# Patient Record
Sex: Male | Born: 1980 | Race: White | Hispanic: No | Marital: Married | Smoking: Never smoker
Health system: Southern US, Community
[De-identification: ages and names within clinical notes are randomized; demographics above are authoritative.]

## PROBLEM LIST (undated history)

## (undated) DIAGNOSIS — R011 Cardiac murmur, unspecified: Secondary | ICD-10-CM

## (undated) DIAGNOSIS — E039 Hypothyroidism, unspecified: Secondary | ICD-10-CM

## (undated) DIAGNOSIS — E119 Type 2 diabetes mellitus without complications: Secondary | ICD-10-CM

## (undated) DIAGNOSIS — I10 Essential (primary) hypertension: Secondary | ICD-10-CM

## (undated) HISTORY — PX: OTHER SURGICAL HISTORY: SHX169

---

## 2015-03-11 ENCOUNTER — Other Ambulatory Visit: Payer: Self-pay | Admitting: Family Medicine

## 2015-03-11 ENCOUNTER — Ambulatory Visit
Admission: RE | Admit: 2015-03-11 | Discharge: 2015-03-11 | Disposition: A | Discharge status: Home / Self Care | Payer: BC Managed Care – PPO | Source: Ambulatory Visit | Attending: Family Medicine | Admitting: Family Medicine

## 2015-03-11 ENCOUNTER — Encounter (INDEPENDENT_AMBULATORY_CARE_PROVIDER_SITE_OTHER): Payer: Self-pay

## 2015-03-11 DIAGNOSIS — R509 Fever, unspecified: Secondary | ICD-10-CM

## 2016-04-12 IMAGING — CR DG CHEST 2V
2 series · 2 of 2 positions shown · non-contrast
Comparison: None.

CLINICAL DATA: Fever and cough for 2 weeks

EXAM:
CHEST  2 VIEW

[w chest pa]
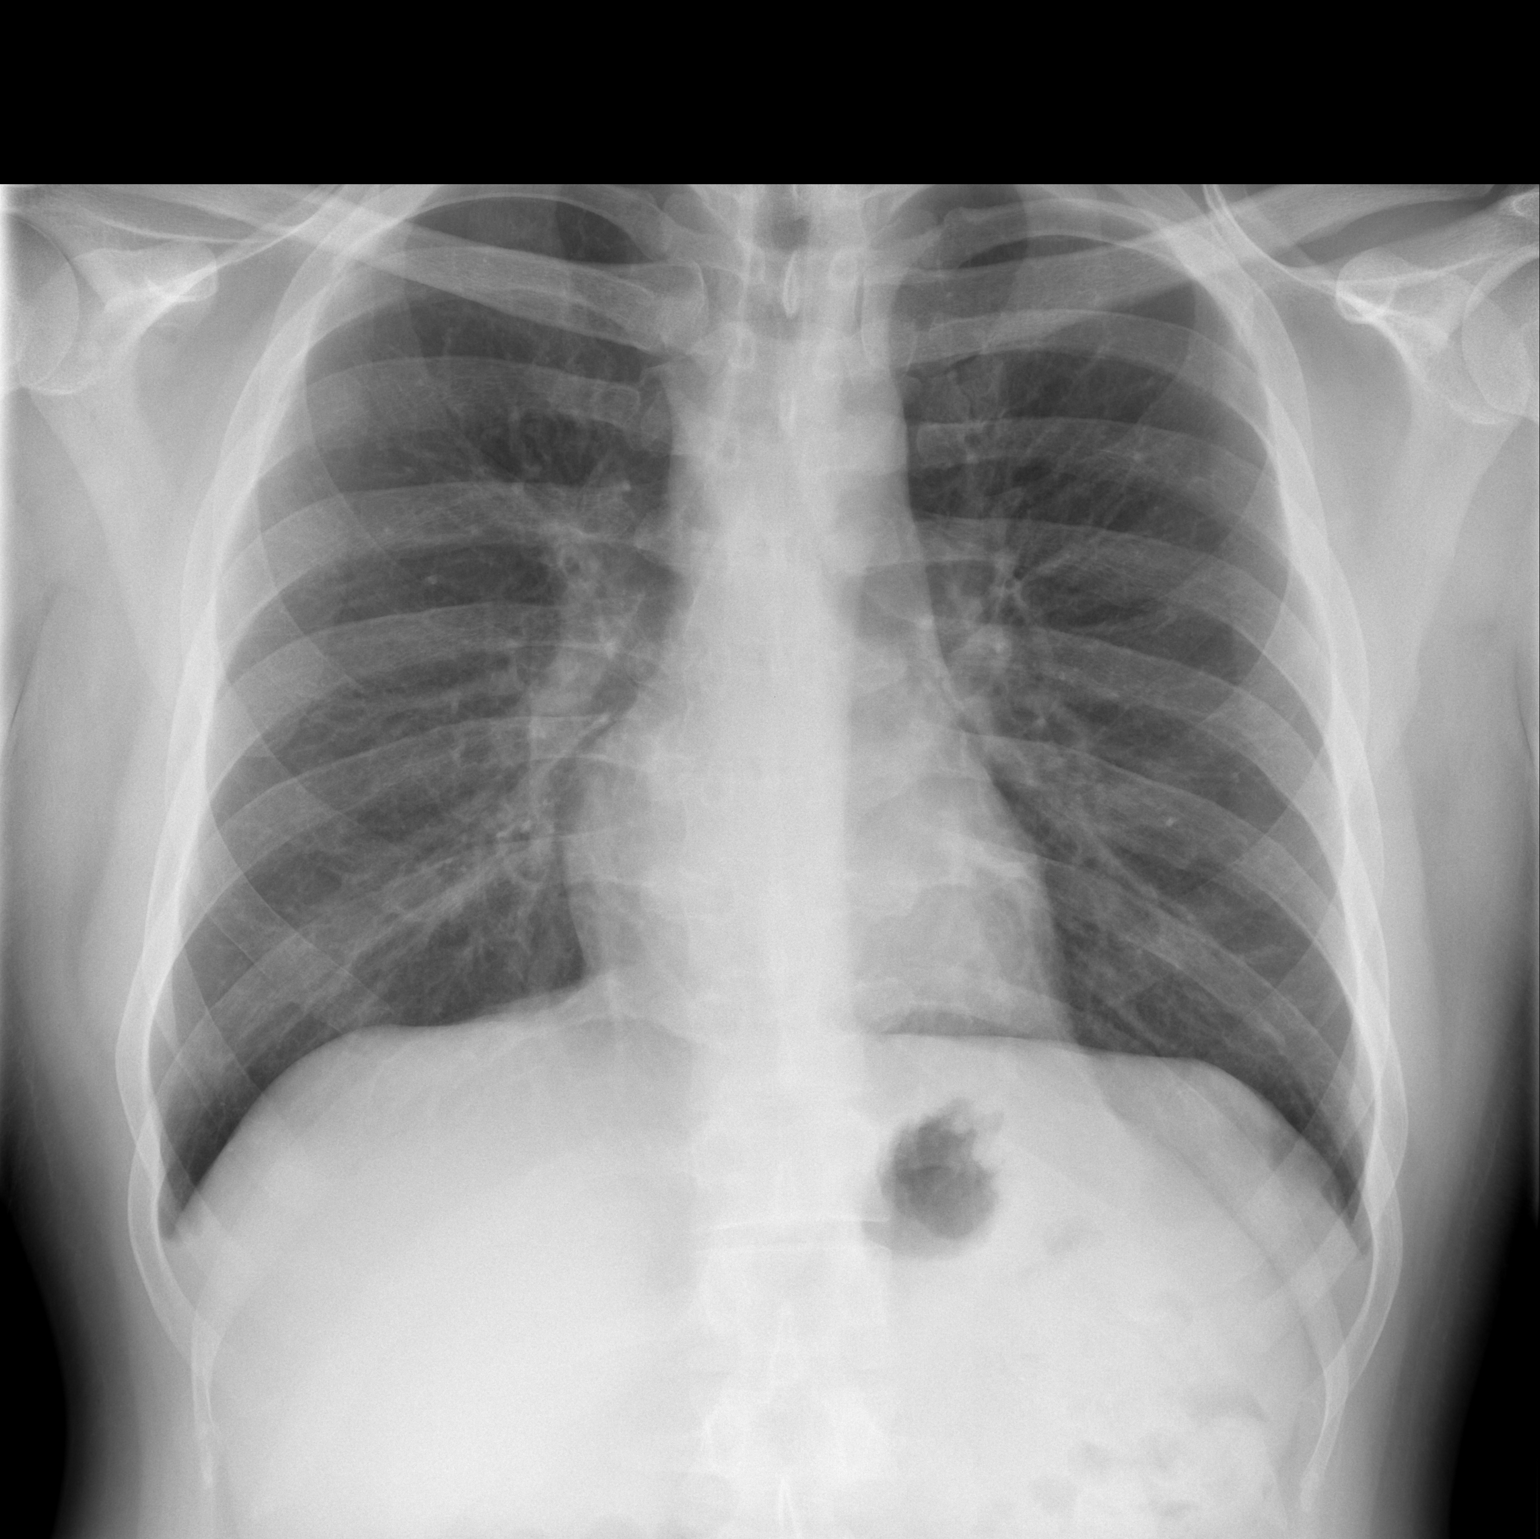

[w chest lat]
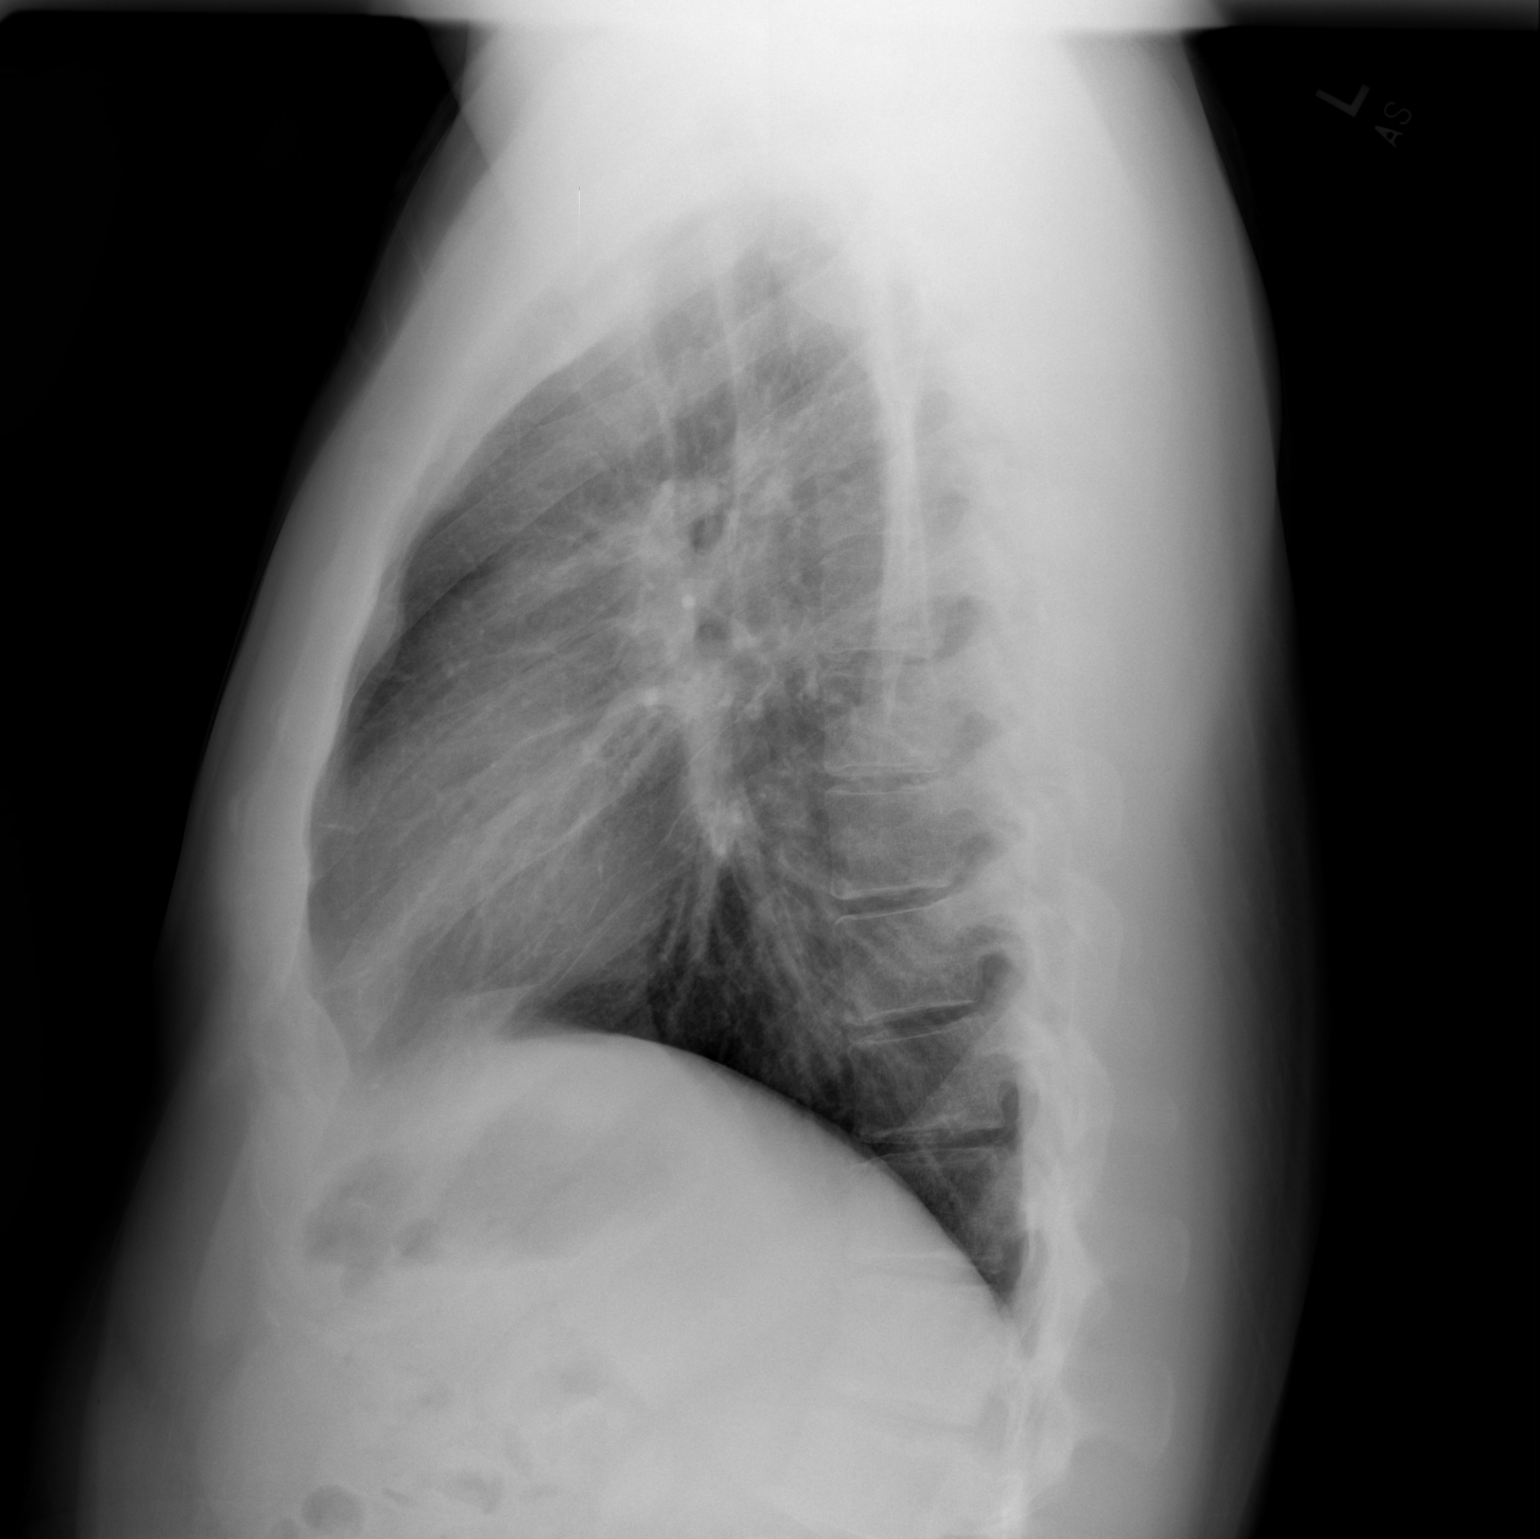

[2 of 2 positions shown; findings below may reference images not displayed]

FINDINGS: Cardiomediastinal silhouette is unremarkable. No acute infiltrate or
pleural effusion. No pulmonary edema. Bony thorax is unremarkable
IMPRESSION: No active cardiopulmonary disease.

## 2020-05-20 ENCOUNTER — Other Ambulatory Visit: Payer: Self-pay | Admitting: Family Medicine

## 2020-05-20 DIAGNOSIS — R7989 Other specified abnormal findings of blood chemistry: Secondary | ICD-10-CM

## 2020-06-03 ENCOUNTER — Ambulatory Visit
Admission: RE | Admit: 2020-06-03 | Discharge: 2020-06-03 | Disposition: A | Discharge status: Home / Self Care | Payer: BC Managed Care – PPO | Source: Ambulatory Visit | Attending: Family Medicine | Admitting: Family Medicine

## 2020-06-03 DIAGNOSIS — R7989 Other specified abnormal findings of blood chemistry: Secondary | ICD-10-CM

## 2020-07-18 ENCOUNTER — Other Ambulatory Visit (HOSPITAL_COMMUNITY)
Admission: RE | Admit: 2020-07-18 | Discharge: 2020-07-18 | Disposition: A | Discharge status: Home / Self Care | Payer: BC Managed Care – PPO | Source: Ambulatory Visit | Attending: Surgery | Admitting: Surgery

## 2020-07-18 ENCOUNTER — Ambulatory Visit: Payer: Self-pay | Admitting: Surgery

## 2020-07-18 DIAGNOSIS — K8010 Calculus of gallbladder with chronic cholecystitis without obstruction: Secondary | ICD-10-CM

## 2020-07-18 DIAGNOSIS — Z01812 Encounter for preprocedural laboratory examination: Secondary | ICD-10-CM | POA: Insufficient documentation

## 2020-07-18 DIAGNOSIS — Z20822 Contact with and (suspected) exposure to covid-19: Secondary | ICD-10-CM | POA: Diagnosis not present

## 2020-07-18 DIAGNOSIS — E669 Obesity, unspecified: Secondary | ICD-10-CM

## 2020-07-18 DIAGNOSIS — E119 Type 2 diabetes mellitus without complications: Secondary | ICD-10-CM | POA: Insufficient documentation

## 2020-07-18 LAB — SARS CORONAVIRUS 2 (TAT 6-24 HRS): SARS Coronavirus 2: NEGATIVE

## 2020-07-18 NOTE — H&P (View-Only) (Signed)
Jordan Harper Appointment: 07/18/2020 11:00 AM Location: [facility] Patient #: [id number] DOB: 09/24/1981 Married / Language: Jordan Harper / Race: White Male  History of Present Illness Jordan Sportsman MD; 07/18/2020 12:19 PM) The patient is a 39 year old male who presents with abdominal pain. Note for "Abdominal pain": ` ` ` Patient sent for surgical consultation at the request of [facility]  Chief Complaint: Abdominal pain. Possible gallbladder etiology. 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 Diabetic on oral hypoglycemic. Goes on regular hikes with his family. Did have some mildly elevated bilirubin and ALT on the liver function tests. Subtle. Did have an ultrasound which revealed gallstones as well as some fatty steatosis. This was discussed with the patient. Patient's aunt had to have emergency gallbladder surgery. He is noted some and interdependent abdominal complaints. Surgery consultation offered. Patient denies any history of prior abdominal surgery. Usually moves his bowels every day or so. That he may have a few drinks of alcohol a month most. Certainly no binge drinking. He notes he will get intermittent right upper quadrant pain with fried food especially. Some mild nausea and bloating but no emesis. Denies heartburn or reflux. Just hiked up with one of the mountains last weekend without any difficulty. No symptoms or pain then.  No personal nor family history of GI/colon cancer, inflammatory bowel disease, irritable bowel syndrome, allergy such as Celiac Sprue, dietary/dairy problems, colitis, ulcers nor gastritis. No recent sick contacts/gastroenteritis. No travel outside the country. No changes in diet. No dysphagia to solids or liquids. No significant heartburn or reflux. No melena, hematemesis, coffee ground emesis. No evidence of prior gastric/peptic ulceration.   (Review of systems as stated in  this history (HPI) or in the review of systems. Otherwise all other 12 point ROS are negative) ` ` `  This patient encounter took 30 minutes today to perform the following: obtain history, perform exam, review outside records, interpret tests & imaging, counsel the patient on their diagnosis; and, document this encounter, including findings & plan in the electronic health record (EHR).   Past Surgical History Jordan Harper, CMA; 07/18/2020 11:07 AM) No pertinent past surgical history  Diagnostic Studies History Jordan Harper, CMA; 07/18/2020 11:07 AM) Colonoscopy never  Allergies (Jordan Harper, CMA; 07/18/2020 11:09 AM) Bactrim *ANTI-INFECTIVE AGENTS - MISC.* Hives, Rash.  Medication History (Jordan Harper, CMA; 07/18/2020 11:10 AM) Valium (2MG  Tablet, Oral) Active. Losartan Potassium-HCTZ (100-12.5MG  Tablet, Oral) Active. Levothyroxine Sodium ( Tablet, Oral) Active. metFORMIN HCl ER (500MG  Tablet ER 24HR, Oral) Active. Bisoprolol-hydroCHLOROthiazide (5-6.25MG  Tablet, Oral) Active. Atorvastatin Calcium (40MG  Tablet, Oral) Active. ZyrTEC Allergy (10MG  Tablet, Oral) Active. Medications Reconciled  Social History , CMA; 07/18/2020 11:07 AM) Alcohol use Occasional alcohol use. Caffeine use Carbonated beverages, Coffee, Tea. No drug use Tobacco use Never smoker.  Family History , CMA; 07/18/2020 11:07 AM) Alcohol Abuse Family Members In General. Diabetes Mellitus Brother, Family Members In General, Father. Heart Disease Family Members In General, Father. Heart disease in male family member before age 71 Hypertension Brother, Mother. Thyroid problems Family Members In General, Mother.  Other Problems (Jordan Harper, CMA; 07/18/2020 11:07 AM) Cholelithiasis Diabetes Mellitus Heart murmur High blood pressure Thyroid Disease     Review of Systems (Jordan Harper CMA; 07/18/2020 11:07 AM) General Not Present-  Appetite Loss, Chills, Fatigue, Fever, Night Sweats, Weight Gain and Weight Loss. Skin Not Present- Change in Wart/Mole, Dryness, Hives, Jaundice, New Lesions, Non-Healing Wounds, Rash and Ulcer. HEENT  Present- Seasonal Allergies and Wears glasses/contact lenses. Not Present- Earache, Hearing Loss, Hoarseness, Nose Bleed, Oral Ulcers, Ringing in the Ears, Sinus Pain, Sore Throat, Visual Disturbances and Yellow Eyes. Respiratory Present- Snoring. Not Present- Bloody sputum, Chronic Cough, Difficulty Breathing and Wheezing. Breast Not Present- Breast Mass, Breast Pain, Nipple Discharge and Skin Changes. Cardiovascular Not Present- Chest Pain, Difficulty Breathing Lying Down, Leg Cramps, Palpitations, Rapid Heart Rate, Shortness of Breath and Swelling of Extremities. Gastrointestinal Present- Abdominal Pain. Not Present- Bloating, Bloody Stool, Change in Bowel Habits, Chronic diarrhea, Constipation, Difficulty Swallowing, Excessive gas, Gets full quickly at meals, Hemorrhoids, Indigestion, Nausea, Rectal Pain and Vomiting. Male Genitourinary Not Present- Blood in Urine, Change in Urinary Stream, Frequency, Impotence, Nocturia, Painful Urination, Urgency and Urine Leakage. Musculoskeletal Present- Back Pain. Not Present- Joint Pain, Joint Stiffness, Muscle Pain, Muscle Weakness and Swelling of Extremities. Neurological Not Present- Decreased Memory, Fainting, Headaches, Numbness, Seizures, Tingling, Tremor, Trouble walking and Weakness. Psychiatric Not Present- Anxiety, Bipolar, Change in Sleep Pattern, Depression, Fearful and Frequent crying. Endocrine Present- New Diabetes. Not Present- Cold Intolerance, Excessive Hunger, Hair Changes, Heat Intolerance and Hot flashes. Hematology Not Present- Blood Thinners, Easy Bruising, Excessive bleeding, Gland problems, HIV and Persistent Infections.  Vitals (Jordan Harper CMA; 07/18/2020 11:08 AM) 07/18/2020 11:08 AM Weight: 224 lb Height: 72in Body Surface  Area: 2.24 m Body Mass Index: 30.38 kg/m  Temp.: 98.44F  Pulse: 75 (Regular)  P.OX: 98% (Room air) BP: 132/82(Sitting, Left Arm, Standard)        Physical Exam Jordan Sportsman MD; 07/18/2020 12:19 PM)  General Mental Status-Alert. General Appearance-Not in acute distress, Not Sickly. Orientation-Oriented X3. Hydration-Well hydrated. Voice-Normal.  Integumentary Global Assessment Upon inspection and palpation of skin surfaces of the - Axillae: non-tender, no inflammation or ulceration, no drainage. and Distribution of scalp and body hair is normal. General Characteristics Temperature - normal warmth is noted.  Head and Neck Head-normocephalic, atraumatic with no lesions or palpable masses. Face Global Assessment - atraumatic, no absence of expression. Neck Global Assessment - no abnormal movements, no bruit auscultated on the right, no bruit auscultated on the left, no decreased range of motion, non-tender. Trachea-midline. Thyroid Gland Characteristics - non-tender.  Eye Eyeball - Left-Extraocular movements intact, No Nystagmus - Left. Eyeball - Right-Extraocular movements intact, No Nystagmus - Right. Cornea - Left-No Hazy - Left. Cornea - Right-No Hazy - Right. Sclera/Conjunctiva - Left-No scleral icterus, No Discharge - Left. Sclera/Conjunctiva - Right-No scleral icterus, No Discharge - Right. Pupil - Left-Direct reaction to light normal. Pupil - Right-Direct reaction to light normal.  ENMT Ears Pinna - Left - no drainage observed, no generalized tenderness observed. Pinna - Right - no drainage observed, no generalized tenderness observed. Nose and Sinuses External Inspection of the Nose - no destructive lesion observed. Inspection of the nares - Left - quiet respiration. Inspection of the nares - Right - quiet respiration. Mouth and Throat Lips - Upper Lip - no fissures observed, no pallor noted. Lower Lip - no fissures  observed, no pallor noted. Nasopharynx - no discharge present. Oral Cavity/Oropharynx - Tongue - no dryness observed. Oral Mucosa - no cyanosis observed. Hypopharynx - no evidence of airway distress observed.  Chest and Lung Exam Inspection Movements - Normal and Symmetrical. Accessory muscles - No use of accessory muscles in breathing. Palpation Palpation of the chest reveals - Non-tender. Auscultation Breath sounds - Normal and Clear.  Cardiovascular Auscultation Rhythm - Regular. Murmurs & Other Heart Sounds - Auscultation of the heart reveals -  No Murmurs and No Systolic Clicks.  Abdomen Inspection Inspection of the abdomen reveals - No Visible peristalsis and No Abnormal pulsations. Umbilicus - No Bleeding, No Urine drainage. Palpation/Percussion Palpation and Percussion of the abdomen reveal - Soft, Non Tender, No Rebound tenderness, No Rigidity (guarding) and No Cutaneous hyperesthesia. Note: Abdomen soft. Mild discomfort along right subcostal ridge. No true Murphy sign. The rest of the abdomen is soft and nontender. Not distended. No umbilical or incisional hernias. No guarding.  Male Genitourinary Sexual Maturity Tanner 5 - Adult hair pattern and Adult penile size and shape.  Peripheral Vascular Upper Extremity Inspection - Left - No Cyanotic nailbeds - Left, Not Ischemic. Inspection - Right - No Cyanotic nailbeds - Right, Not Ischemic.  Neurologic Neurologic evaluation reveals -normal attention span and ability to concentrate, able to name objects and repeat phrases. Appropriate fund of knowledge , normal sensation and normal coordination. Mental Status Affect - not angry, not paranoid. Cranial Nerves-Normal Bilaterally. Gait-Normal.  Neuropsychiatric Mental status exam performed with findings of-able to articulate well with normal speech/language, rate, volume and coherence, thought content normal with ability to perform basic computations and apply  abstract reasoning and no evidence of hallucinations, delusions, obsessions or homicidal/suicidal ideation.  Musculoskeletal Global Assessment Spine, Ribs and Pelvis - no instability, subluxation or laxity. Right Upper Extremity - no instability, subluxation or laxity.  Lymphatic Head & Neck  General Head & Neck Lymphatics: Bilateral - Description - No Localized lymphadenopathy. Axillary  General Axillary Region: Bilateral - Description - No Localized lymphadenopathy. Femoral & Inguinal  Generalized Femoral & Inguinal Lymphatics: Left - Description - No Localized lymphadenopathy. Right - Description - No Localized lymphadenopathy.    Assessment & Plan Jordan Sportsman MD; 07/18/2020 11:36 AM)  CHRONIC CHOLECYSTITIS WITH CALCULUS (K80.10) Impression: Patient was stones now having classic episodes of biliary colic.  Think he would benefit from cholecystectomy. He's been frustrated with intermittent attacks and wishes to have something done. He wishes to proceed. 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  He does have some steatosis of the liver. Might need to do a core liver biopsy to help explain his increased liver function tests. They're not severely elevated though  Current Plans You are being scheduled for surgery- Our schedulers will call you.  You should hear from our office's scheduling department within 5 working days about the location, date, and time of surgery. We try to make accommodations for patient's preferences in scheduling surgery, but sometimes the OR schedule or the surgeon's schedule prevents [place] from making those accommodations.  If you have not heard from our office [phone]) in 5 working days, call the office and ask for your surgeon's nurse.  If you have other questions about your diagnosis, plan, or surgery, call the office and ask for your surgeon's nurse.  Written instructions provided Pt  Education - Pamphlet Given - Laparoscopic Gallbladder Surgery: discussed with patient and provided information. The anatomy & physiology of hepatobiliary & pancreatic function was discussed. The pathophysiology of gallbladder dysfunction was discussed. Natural history risks without surgery was discussed. I feel the risks of no intervention will lead to serious problems that outweigh the operative risks; therefore, I recommended cholecystectomy to remove the pathology. I explained laparoscopic techniques with possible need for an open approach. Probable cholangiogram to evaluate the bilary tract was explained as well.  Risks such as bleeding, infection, abscess, leak, injury to other organs, need for further  treatment, heart attack, death, and other risks were discussed. I noted a good likelihood this will help address the problem. Possibility that this will not correct all abdominal symptoms was explained. Goals of post-operative recovery were discussed as well. We will work to minimize complications. An educational handout further explaining the pathology and treatment options was given as well. Questions were answered. The patient expresses understanding & wishes to proceed with surgery.  Pt Education - [facility] Laparosopic Post Op HCI (Presley Gora) Pt Education - [facility] Good Bowel Health (Milik Gilreath) Pt Education - Laparoscopic Cholecystectomy: gallbladder  BMI 30.0-30.9,ADULT (Z68.30)  Jordan SportsmanSteven C. Kendalynn Wideman, MD, FACS, MASCRS Gastrointestinal and Minimally Invasive Surgery  [facility] [address] [place], [place] [id number] [phone] Fax [phone] Main/Paging  CONTACT INFORMATION: Weekday (9AM-5PM) concerns: Call [facility] main office at [phone] Weeknight (5PM-9AM) or Weekend/Holiday concerns: Check [email or web address] for General Surgery [facility] coverage (Please, do not use SecureChat as it is not reliable communication to operating surgeons for immediate patient care)

## 2020-07-18 NOTE — H&P (Signed)
Lauro Regulus Appointment: 07/18/2020 11:00 AM Location: [facility] Patient #: [id number] DOB: 09/24/1981 Married / Language: [name] / Race: White Male  History of Present Illness Ardeth Sportsman MD; 07/18/2020 12:19 PM) The patient is a 39 year old male who presents with abdominal pain. Note for "Abdominal pain": ` ` ` Patient sent for surgical consultation at the request of [facility]  Chief Complaint: Abdominal pain. Possible gallbladder etiology. 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 Diabetic on oral hypoglycemic. Goes on regular hikes with his family. Did have some mildly elevated bilirubin and ALT on the liver function tests. Subtle. Did have an ultrasound which revealed gallstones as well as some fatty steatosis. This was discussed with the patient. Patient's aunt had to have emergency gallbladder surgery. He is noted some and interdependent abdominal complaints. Surgery consultation offered. Patient denies any history of prior abdominal surgery. Usually moves his bowels every day or so. That he may have a few drinks of alcohol a month most. Certainly no binge drinking. He notes he will get intermittent right upper quadrant pain with fried food especially. Some mild nausea and bloating but no emesis. Denies heartburn or reflux. Just hiked up with one of the mountains last weekend without any difficulty. No symptoms or pain then.  No personal nor family history of GI/colon cancer, inflammatory bowel disease, irritable bowel syndrome, allergy such as Celiac Sprue, dietary/dairy problems, colitis, ulcers nor gastritis. No recent sick contacts/gastroenteritis. No travel outside the country. No changes in diet. No dysphagia to solids or liquids. No significant heartburn or reflux. No melena, hematemesis, coffee ground emesis. No evidence of prior gastric/peptic ulceration.   (Review of systems as stated in  this history (HPI) or in the review of systems. Otherwise all other 12 point ROS are negative) ` ` `  This patient encounter took 30 minutes today to perform the following: obtain history, perform exam, review outside records, interpret tests & imaging, counsel the patient on their diagnosis; and, document this encounter, including findings & plan in the electronic health record (EHR).   Past Surgical History Renee Ramus, CMA; 07/18/2020 11:07 AM) No pertinent past surgical history  Diagnostic Studies History Renee Ramus, CMA; 07/18/2020 11:07 AM) Colonoscopy never  Allergies (Armen Emelda Fear, CMA; 07/18/2020 11:09 AM) Bactrim *ANTI-INFECTIVE AGENTS - MISC.* Hives, Rash.  Medication History (Armen Ferguson, CMA; 07/18/2020 11:10 AM) Valium (2MG  Tablet, Oral) Active. Losartan Potassium-HCTZ (100-12.5MG  Tablet, Oral) Active. Levothyroxine Sodium ( Tablet, Oral) Active. metFORMIN HCl ER (500MG  Tablet ER 24HR, Oral) Active. Bisoprolol-hydroCHLOROthiazide (5-6.25MG  Tablet, Oral) Active. Atorvastatin Calcium (40MG  Tablet, Oral) Active. ZyrTEC Allergy (10MG  Tablet, Oral) Active. Medications Reconciled  Social History , CMA; 07/18/2020 11:07 AM) Alcohol use Occasional alcohol use. Caffeine use Carbonated beverages, Coffee, Tea. No drug use Tobacco use Never smoker.  Family History , CMA; 07/18/2020 11:07 AM) Alcohol Abuse Family Members In General. Diabetes Mellitus Brother, Family Members In General, Father. Heart Disease Family Members In General, Father. Heart disease in male family member before age 71 Hypertension Brother, Mother. Thyroid problems Family Members In General, Mother.  Other Problems (Armen Ferguson, CMA; 07/18/2020 11:07 AM) Cholelithiasis Diabetes Mellitus Heart murmur High blood pressure Thyroid Disease     Review of Systems (Armen Ferguson CMA; 07/18/2020 11:07 AM) General Not Present-  Appetite Loss, Chills, Fatigue, Fever, Night Sweats, Weight Gain and Weight Loss. Skin Not Present- Change in Wart/Mole, Dryness, Hives, Jaundice, New Lesions, Non-Healing Wounds, Rash and Ulcer. HEENT  Present- Seasonal Allergies and Wears glasses/contact lenses. Not Present- Earache, Hearing Loss, Hoarseness, Nose Bleed, Oral Ulcers, Ringing in the Ears, Sinus Pain, Sore Throat, Visual Disturbances and Yellow Eyes. Respiratory Present- Snoring. Not Present- Bloody sputum, Chronic Cough, Difficulty Breathing and Wheezing. Breast Not Present- Breast Mass, Breast Pain, Nipple Discharge and Skin Changes. Cardiovascular Not Present- Chest Pain, Difficulty Breathing Lying Down, Leg Cramps, Palpitations, Rapid Heart Rate, Shortness of Breath and Swelling of Extremities. Gastrointestinal Present- Abdominal Pain. Not Present- Bloating, Bloody Stool, Change in Bowel Habits, Chronic diarrhea, Constipation, Difficulty Swallowing, Excessive gas, Gets full quickly at meals, Hemorrhoids, Indigestion, Nausea, Rectal Pain and Vomiting. Male Genitourinary Not Present- Blood in Urine, Change in Urinary Stream, Frequency, Impotence, Nocturia, Painful Urination, Urgency and Urine Leakage. Musculoskeletal Present- Back Pain. Not Present- Joint Pain, Joint Stiffness, Muscle Pain, Muscle Weakness and Swelling of Extremities. Neurological Not Present- Decreased Memory, Fainting, Headaches, Numbness, Seizures, Tingling, Tremor, Trouble walking and Weakness. Psychiatric Not Present- Anxiety, Bipolar, Change in Sleep Pattern, Depression, Fearful and Frequent crying. Endocrine Present- New Diabetes. Not Present- Cold Intolerance, Excessive Hunger, Hair Changes, Heat Intolerance and Hot flashes. Hematology Not Present- Blood Thinners, Easy Bruising, Excessive bleeding, Gland problems, HIV and Persistent Infections.  Vitals (Armen Ferguson CMA; 07/18/2020 11:08 AM) 07/18/2020 11:08 AM Weight: 224 lb Height: 72in Body Surface  Area: 2.24 m Body Mass Index: 30.38 kg/m  Temp.: 98.44F  Pulse: 75 (Regular)  P.OX: 98% (Room air) BP: 132/82(Sitting, Left Arm, Standard)        Physical Exam Ardeth Sportsman MD; 07/18/2020 12:19 PM)  General Mental Status-Alert. General Appearance-Not in acute distress, Not Sickly. Orientation-Oriented X3. Hydration-Well hydrated. Voice-Normal.  Integumentary Global Assessment Upon inspection and palpation of skin surfaces of the - Axillae: non-tender, no inflammation or ulceration, no drainage. and Distribution of scalp and body hair is normal. General Characteristics Temperature - normal warmth is noted.  Head and Neck Head-normocephalic, atraumatic with no lesions or palpable masses. Face Global Assessment - atraumatic, no absence of expression. Neck Global Assessment - no abnormal movements, no bruit auscultated on the right, no bruit auscultated on the left, no decreased range of motion, non-tender. Trachea-midline. Thyroid Gland Characteristics - non-tender.  Eye Eyeball - Left-Extraocular movements intact, No Nystagmus - Left. Eyeball - Right-Extraocular movements intact, No Nystagmus - Right. Cornea - Left-No Hazy - Left. Cornea - Right-No Hazy - Right. Sclera/Conjunctiva - Left-No scleral icterus, No Discharge - Left. Sclera/Conjunctiva - Right-No scleral icterus, No Discharge - Right. Pupil - Left-Direct reaction to light normal. Pupil - Right-Direct reaction to light normal.  ENMT Ears Pinna - Left - no drainage observed, no generalized tenderness observed. Pinna - Right - no drainage observed, no generalized tenderness observed. Nose and Sinuses External Inspection of the Nose - no destructive lesion observed. Inspection of the nares - Left - quiet respiration. Inspection of the nares - Right - quiet respiration. Mouth and Throat Lips - Upper Lip - no fissures observed, no pallor noted. Lower Lip - no fissures  observed, no pallor noted. Nasopharynx - no discharge present. Oral Cavity/Oropharynx - Tongue - no dryness observed. Oral Mucosa - no cyanosis observed. Hypopharynx - no evidence of airway distress observed.  Chest and Lung Exam Inspection Movements - Normal and Symmetrical. Accessory muscles - No use of accessory muscles in breathing. Palpation Palpation of the chest reveals - Non-tender. Auscultation Breath sounds - Normal and Clear.  Cardiovascular Auscultation Rhythm - Regular. Murmurs & Other Heart Sounds - Auscultation of the heart reveals -  No Murmurs and No Systolic Clicks.  Abdomen Inspection Inspection of the abdomen reveals - No Visible peristalsis and No Abnormal pulsations. Umbilicus - No Bleeding, No Urine drainage. Palpation/Percussion Palpation and Percussion of the abdomen reveal - Soft, Non Tender, No Rebound tenderness, No Rigidity (guarding) and No Cutaneous hyperesthesia. Note: Abdomen soft. Mild discomfort along right subcostal ridge. No true Murphy sign. The rest of the abdomen is soft and nontender. Not distended. No umbilical or incisional hernias. No guarding.  Male Genitourinary Sexual Maturity Tanner 5 - Adult hair pattern and Adult penile size and shape.  Peripheral Vascular Upper Extremity Inspection - Left - No Cyanotic nailbeds - Left, Not Ischemic. Inspection - Right - No Cyanotic nailbeds - Right, Not Ischemic.  Neurologic Neurologic evaluation reveals -normal attention span and ability to concentrate, able to name objects and repeat phrases. Appropriate fund of knowledge , normal sensation and normal coordination. Mental Status Affect - not angry, not paranoid. Cranial Nerves-Normal Bilaterally. Gait-Normal.  Neuropsychiatric Mental status exam performed with findings of-able to articulate well with normal speech/language, rate, volume and coherence, thought content normal with ability to perform basic computations and apply  abstract reasoning and no evidence of hallucinations, delusions, obsessions or homicidal/suicidal ideation.  Musculoskeletal Global Assessment Spine, Ribs and Pelvis - no instability, subluxation or laxity. Right Upper Extremity - no instability, subluxation or laxity.  Lymphatic Head & Neck  General Head & Neck Lymphatics: Bilateral - Description - No Localized lymphadenopathy. Axillary  General Axillary Region: Bilateral - Description - No Localized lymphadenopathy. Femoral & Inguinal  Generalized Femoral & Inguinal Lymphatics: Left - Description - No Localized lymphadenopathy. Right - Description - No Localized lymphadenopathy.    Assessment & Plan Ardeth Sportsman MD; 07/18/2020 11:36 AM)  CHRONIC CHOLECYSTITIS WITH CALCULUS (K80.10) Impression: Patient was stones now having classic episodes of biliary colic.  Think he would benefit from cholecystectomy. He's been frustrated with intermittent attacks and wishes to have something done. He wishes to proceed. 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  He does have some steatosis of the liver. Might need to do a core liver biopsy to help explain his increased liver function tests. They're not severely elevated though  Current Plans You are being scheduled for surgery- Our schedulers will call you.  You should hear from our office's scheduling department within 5 working days about the location, date, and time of surgery. We try to make accommodations for patient's preferences in scheduling surgery, but sometimes the OR schedule or the surgeon's schedule prevents [place] from making those accommodations.  If you have not heard from our office [phone]) in 5 working days, call the office and ask for your surgeon's nurse.  If you have other questions about your diagnosis, plan, or surgery, call the office and ask for your surgeon's nurse.  Written instructions provided Pt  Education - Pamphlet Given - Laparoscopic Gallbladder Surgery: discussed with patient and provided information. The anatomy & physiology of hepatobiliary & pancreatic function was discussed. The pathophysiology of gallbladder dysfunction was discussed. Natural history risks without surgery was discussed. I feel the risks of no intervention will lead to serious problems that outweigh the operative risks; therefore, I recommended cholecystectomy to remove the pathology. I explained laparoscopic techniques with possible need for an open approach. Probable cholangiogram to evaluate the bilary tract was explained as well.  Risks such as bleeding, infection, abscess, leak, injury to other organs, need for further  treatment, heart attack, death, and other risks were discussed. I noted a good likelihood this will help address the problem. Possibility that this will not correct all abdominal symptoms was explained. Goals of post-operative recovery were discussed as well. We will work to minimize complications. An educational handout further explaining the pathology and treatment options was given as well. Questions were answered. The patient expresses understanding & wishes to proceed with surgery.  Pt Education - [facility] Laparosopic Post Op HCI (Deldrick Linch) Pt Education - [facility] Good Bowel Health (Liesl Simons) Pt Education - Laparoscopic Cholecystectomy: gallbladder  BMI 30.0-30.9,ADULT (Z68.30)  Darlene Bartelt C. Joyce Heitman, MD, FACS, MASCRS Gastrointestinal and Minimally Invasive Surgery  [facility] [address], [address] [place], [place] [address] [phone] Fax [phone] Main/Paging  CONTACT INFORMATION: Weekday (9AM-5PM) concerns: Call [facility] main office at [phone] Weeknight (5PM-9AM) or Weekend/Holiday concerns: Check [email or web address] for General Surgery [facility] coverage (Please, do not use SecureChat as it is not reliable communication to operating surgeons for immediate patient care)        

## 2020-07-19 ENCOUNTER — Encounter (HOSPITAL_COMMUNITY)
Admission: RE | Admit: 2020-07-19 | Discharge: 2020-07-19 | Disposition: A | Discharge status: Home / Self Care | Payer: BC Managed Care – PPO | Source: Ambulatory Visit | Attending: Surgery | Admitting: Surgery

## 2020-07-19 ENCOUNTER — Encounter (HOSPITAL_COMMUNITY): Payer: Self-pay | Admitting: Surgery

## 2020-07-19 ENCOUNTER — Other Ambulatory Visit: Payer: Self-pay

## 2020-07-19 ENCOUNTER — Ambulatory Visit: Payer: Self-pay | Admitting: Surgery

## 2020-07-19 DIAGNOSIS — Z01818 Encounter for other preprocedural examination: Secondary | ICD-10-CM | POA: Diagnosis not present

## 2020-07-19 LAB — CBC
HCT: 46.3 % (ref 39.0–52.0)
Hemoglobin: 15.3 g/dL (ref 13.0–17.0)
MCH: 29.3 pg (ref 26.0–34.0)
MCHC: 33 g/dL (ref 30.0–36.0)
MCV: 88.7 fL (ref 80.0–100.0)
Platelets: 223 10*3/uL (ref 150–400)
RBC: 5.22 MIL/uL (ref 4.22–5.81)
RDW: 12.4 % (ref 11.5–15.5)
WBC: 7.7 10*3/uL (ref 4.0–10.5)
nRBC: 0 % (ref 0.0–0.2)

## 2020-07-19 LAB — BASIC METABOLIC PANEL
Anion gap: 11 (ref 5–15)
BUN: 16 mg/dL (ref 6–20)
CO2: 30 mmol/L (ref 22–32)
Calcium: 9.9 mg/dL (ref 8.9–10.3)
Chloride: 99 mmol/L (ref 98–111)
Creatinine, Ser: 1.09 mg/dL (ref 0.61–1.24)
GFR calc Af Amer: >60 mL/min (ref >60)
GFR calc non Af Amer: >60 mL/min (ref >60)
Glucose, Bld: 133 mg/dL — ABNORMAL HIGH (ref 70–99)
Potassium: 3.8 mmol/L (ref 3.5–5.1)
Sodium: 140 mmol/L (ref 135–145)

## 2020-07-19 LAB — HEMOGLOBIN A1C
Hgb A1c MFr Bld: 7.2 % — ABNORMAL HIGH (ref 4.8–5.6)
Mean Plasma Glucose: 159.94 mg/dL

## 2020-07-19 NOTE — Progress Notes (Signed)
DUE TO COVID-19 ONLY ONE VISITOR IS ALLOWED TO COME WITH YOU AND STAY IN THE WAITING ROOM ONLY DURING PRE OP AND PROCEDURE DAY OF SURGERY. THE 1 VISITOR MAY VISIT WITH YOU AFTER SURGERY IN YOUR PRIVATE ROOM DURING VISITING HOURS ONLY!  YOU NEED TO HAVE A COVID 19 TEST ON_______ @_______ , THIS TEST MUST BE DONE BEFORE SURGERY, COME  [address], [place] , .  [facility]) ONCE YOUR COVID TEST IS COMPLETED, PLEASE BEGIN THE QUARANTINE INSTRUCTIONS AS OUTLINED IN YOUR HANDOUT.                Jordan Harper  07/19/2020   Your procedure is scheduled on:  07/21/2020   Report to [facility] Main  Entrance   Report to admitting at    0730 AM     Call this number if you have problems the morning of surgery [phone]    Remember: Do not eat food    :After Midnight. BRUSH YOUR TEETH MORNING OF SURGERY AND RINSE YOUR MOUTH OUT, NO CHEWING GUM CANDY OR MINTS.     Take these medicines the morning of surgery with A SIP OF WATER:   DO NOT TAKE ANY DIABETIC MEDICATIONS DAY OF YOUR SURGERY                               You may not have any metal on your body including hair pins and              piercings  Do not wear jewelry, , lotions, powders or perfumes, deodorant              Men may shave face and neck.   Do not bring valuables to the hospital. [facility] IS NOT             RESPONSIBLE   FOR VALUABLES.  Contacts, dentures or bridgework may not be worn into surgery.  Leave suitcase in the car. After surgery it may be brought to your room.     Patients discharged the day of surgery will not be allowed to drive home. IF YOU ARE HAVING SURGERY AND GOING HOME THE SAME DAY, YOU MUST HAVE AN ADULT TO DRIVE YOU HOME AND BE WITH YOU FOR 24 HOURS. YOU MAY GO HOME BY TAXI OR UBER OR ORTHERWISE, BUT AN ADULT MUST ACCOMPANY YOU HOME AND STAY WITH YOU FOR 24 HOURS.  Name and phone number of your driver:               Please read over the following fact sheets you  were given: _____________________________________________________________________             NO SOLID FOOD AFTER MIDNIGHT THE NIGHT PRIOR TO SURGERY. NOTHING BY MOUTH EXCEPT CLEAR LIQUIDS UNTIL   0630am . PLEASE FINISH G2 DRINK PER SURGEON ORDER  WHICH NEEDS TO BE COMPLETED AT .  12-10-1998   CLEAR LIQUID DIET   Foods Allowed                                                    Coffee and tea, regular and decaf  Plain Jell-O any favor except red or purple                                          Fruit ices (not with fruit pulp)                                  Iced Popsicles                                    Carbonated beverages, regular and diet                                    Cranberry, grape and apple juices Sports drinks like Gatorade Lightly seasoned clear broth or consume(fat free) Sugar, honey syrup                                                                                   _____________________________________________________________________  [facility] - Preparing for Surgery Before surgery, you can play an important role.  Because skin is not sterile, your skin needs to be as free of germs as possible.  You can reduce the number of germs on your skin by washing with CHG (chlorahexidine gluconate) soap before surgery.  CHG is an antiseptic cleaner which kills germs and bonds with the skin to continue killing germs even after washing. Please DO NOT use if you have an allergy to CHG or antibacterial soaps.  If your skin becomes reddened/irritated stop using the CHG and inform your nurse when you arrive at Short Stay. Do not shave (including legs and underarms) for at least 48 hours prior to the first CHG shower.  You may shave your face/neck. Please follow these instructions carefully:  1.  Shower with CHG Soap the night before surgery and the  morning of Surgery.  2.  If you choose to wash your hair, wash your hair first as usual with  your  normal  shampoo.  3.  After you shampoo, rinse your hair and body thoroughly to remove the  shampoo.                           4.  Use CHG as you would any other liquid soap.  You can apply chg directly  to the skin and wash                       Gently with a scrungie or clean washcloth.  5.  Apply the CHG Soap to your body ONLY FROM THE NECK DOWN.   Do not use on face/ open                           Wound or open sores. Avoid contact with eyes, ears mouth and genitals (private  parts).                       Wash face,  Genitals (private parts) with your normal soap.             6.  Wash thoroughly, paying special attention to the area where your surgery  will be performed.  7.  Thoroughly rinse your body with warm water from the neck down.  8.  DO NOT shower/wash with your normal soap after using and rinsing off  the CHG Soap.                9.  Pat yourself dry with a clean towel.            10.  Wear clean pajamas.            11.  Place clean sheets on your bed the night of your first shower and do not  sleep with pets. Day of Surgery : Do not apply any lotions/deodorants the morning of surgery.  Please wear clean clothes to the hospital/surgery center.  FAILURE TO FOLLOW THESE INSTRUCTIONS MAY RESULT IN THE CANCELLATION OF YOUR SURGERY PATIENT SIGNATURE_________________________________  NURSE SIGNATURE__________________________________  ________________________________________________________________________

## 2020-07-19 NOTE — Progress Notes (Signed)
Called and requested most recent HGBA1C results from [place] at [place].

## 2020-07-20 MED ORDER — BUPIVACAINE LIPOSOME 1.3 % IJ SUSP
20.0000 mL | Freq: Once | INTRAMUSCULAR | Status: DC
  Filled 2020-07-20: qty 20

## 2020-07-21 ENCOUNTER — Ambulatory Visit (HOSPITAL_COMMUNITY): Payer: BC Managed Care – PPO | Admitting: Certified Registered Nurse Anesthetist

## 2020-07-21 ENCOUNTER — Ambulatory Visit (HOSPITAL_COMMUNITY): Payer: BC Managed Care – PPO

## 2020-07-21 ENCOUNTER — Encounter (HOSPITAL_COMMUNITY)
Admission: RE | Disposition: A | Discharge status: Home / Self Care | Payer: Self-pay | Source: Home / Self Care | Attending: Surgery

## 2020-07-21 ENCOUNTER — Encounter (HOSPITAL_COMMUNITY): Payer: Self-pay | Admitting: Surgery

## 2020-07-21 ENCOUNTER — Ambulatory Visit (HOSPITAL_COMMUNITY)
Admission: RE | Admit: 2020-07-21 | Discharge: 2020-07-21 | Disposition: A | Discharge status: Home / Self Care | Payer: BC Managed Care – PPO | Attending: Surgery | Admitting: Surgery

## 2020-07-21 DIAGNOSIS — Z7984 Long term (current) use of oral hypoglycemic drugs: Secondary | ICD-10-CM | POA: Diagnosis not present

## 2020-07-21 DIAGNOSIS — I10 Essential (primary) hypertension: Secondary | ICD-10-CM | POA: Diagnosis present

## 2020-07-21 DIAGNOSIS — Z8249 Family history of ischemic heart disease and other diseases of the circulatory system: Secondary | ICD-10-CM | POA: Diagnosis not present

## 2020-07-21 DIAGNOSIS — E039 Hypothyroidism, unspecified: Secondary | ICD-10-CM | POA: Diagnosis present

## 2020-07-21 DIAGNOSIS — E119 Type 2 diabetes mellitus without complications: Secondary | ICD-10-CM | POA: Diagnosis not present

## 2020-07-21 DIAGNOSIS — E669 Obesity, unspecified: Secondary | ICD-10-CM | POA: Diagnosis present

## 2020-07-21 DIAGNOSIS — K660 Peritoneal adhesions (postprocedural) (postinfection): Secondary | ICD-10-CM | POA: Insufficient documentation

## 2020-07-21 DIAGNOSIS — K7581 Nonalcoholic steatohepatitis (NASH): Secondary | ICD-10-CM | POA: Diagnosis not present

## 2020-07-21 DIAGNOSIS — Z881 Allergy status to other antibiotic agents status: Secondary | ICD-10-CM | POA: Diagnosis not present

## 2020-07-21 DIAGNOSIS — K8010 Calculus of gallbladder with chronic cholecystitis without obstruction: Secondary | ICD-10-CM | POA: Diagnosis not present

## 2020-07-21 DIAGNOSIS — Z833 Family history of diabetes mellitus: Secondary | ICD-10-CM | POA: Diagnosis not present

## 2020-07-21 DIAGNOSIS — Z6830 Body mass index (BMI) 30.0-30.9, adult: Secondary | ICD-10-CM | POA: Insufficient documentation

## 2020-07-21 DIAGNOSIS — Z79899 Other long term (current) drug therapy: Secondary | ICD-10-CM | POA: Insufficient documentation

## 2020-07-21 DIAGNOSIS — Z419 Encounter for procedure for purposes other than remedying health state, unspecified: Secondary | ICD-10-CM

## 2020-07-21 HISTORY — DX: Cardiac murmur, unspecified: R01.1

## 2020-07-21 HISTORY — DX: Hypothyroidism, unspecified: E03.9

## 2020-07-21 HISTORY — PX: LAPAROSCOPIC CHOLECYSTECTOMY SINGLE SITE WITH INTRAOPERATIVE CHOLANGIOGRAM: SHX6538

## 2020-07-21 HISTORY — DX: Essential (primary) hypertension: I10

## 2020-07-21 HISTORY — DX: Type 2 diabetes mellitus without complications: E11.9

## 2020-07-21 HISTORY — PX: LIVER BIOPSY: SHX301

## 2020-07-21 LAB — GLUCOSE, CAPILLARY
Glucose-Capillary: 137 mg/dL — ABNORMAL HIGH (ref 70–99)
Glucose-Capillary: 189 mg/dL — ABNORMAL HIGH (ref 70–99)

## 2020-07-21 SURGERY — LAPAROSCOPIC CHOLECYSTECTOMY SINGLE SITE WITH INTRAOPERATIVE CHOLANGIOGRAM
Anesthesia: General

## 2020-07-21 MED ORDER — BUPIVACAINE-EPINEPHRINE 0.25% -1:200000 IJ SOLN
INTRAMUSCULAR | Status: AC
  Filled 2020-07-21: qty 1

## 2020-07-21 MED ORDER — BUPIVACAINE-EPINEPHRINE 0.25% -1:200000 IJ SOLN
INTRAMUSCULAR | Status: DC | PRN
  Administered 2020-07-21: 30 mL

## 2020-07-21 MED ORDER — SODIUM CHLORIDE 0.9 % IV SOLN: 2.0000 g | Freq: Once | INTRAVENOUS | Status: DC

## 2020-07-21 MED ORDER — FENTANYL CITRATE (PF) 250 MCG/5ML IJ SOLN
INTRAMUSCULAR | Status: AC
  Filled 2020-07-21: qty 5

## 2020-07-21 MED ORDER — CHLORHEXIDINE GLUCONATE CLOTH 2 % EX PADS: 6.0000 | MEDICATED_PAD | Freq: Once | CUTANEOUS | Status: DC

## 2020-07-21 MED ORDER — LIDOCAINE 2% (20 MG/ML) 5 ML SYRINGE
INTRAMUSCULAR | Status: DC | PRN
Start: 2020-07-21 — End: 2020-07-21
  Administered 2020-07-21: 1.5 mg/kg/h via INTRAVENOUS

## 2020-07-21 MED ORDER — TRAMADOL HCL 50 MG PO TABS: 50.0000 mg | ORAL_TABLET | Freq: Four times a day (QID) | ORAL | 0 refills | Status: AC | PRN

## 2020-07-21 MED ORDER — DEXAMETHASONE SODIUM PHOSPHATE 10 MG/ML IJ SOLN
INTRAMUSCULAR | Status: AC
  Filled 2020-07-21: qty 1

## 2020-07-21 MED ORDER — BUPIVACAINE-EPINEPHRINE (PF) 0.25% -1:200000 IJ SOLN
INTRAMUSCULAR | Status: AC
  Filled 2020-07-21: qty 30

## 2020-07-21 MED ORDER — IOHEXOL 300 MG/ML  SOLN
INTRAMUSCULAR | Status: DC | PRN
  Administered 2020-07-21: 16 mL

## 2020-07-21 MED ORDER — ONDANSETRON HCL 4 MG/2ML IJ SOLN
INTRAMUSCULAR | Status: AC
  Filled 2020-07-21: qty 2

## 2020-07-21 MED ORDER — LIDOCAINE 2% (20 MG/ML) 5 ML SYRINGE
INTRAMUSCULAR | Status: DC | PRN
  Administered 2020-07-21: 80 mg via INTRAVENOUS

## 2020-07-21 MED ORDER — PHENYLEPHRINE 40 MCG/ML (10ML) SYRINGE FOR IV PUSH (FOR BLOOD PRESSURE SUPPORT)
PREFILLED_SYRINGE | INTRAVENOUS | Status: AC
  Filled 2020-07-21: qty 10

## 2020-07-21 MED ORDER — CHLORHEXIDINE GLUCONATE 0.12 % MT SOLN
15.0000 mL | Freq: Once | OROMUCOSAL | Status: AC
  Administered 2020-07-21: 15 mL via OROMUCOSAL

## 2020-07-21 MED ORDER — ROCURONIUM BROMIDE 10 MG/ML (PF) SYRINGE
PREFILLED_SYRINGE | INTRAVENOUS | Status: AC
  Filled 2020-07-21: qty 10

## 2020-07-21 MED ORDER — CELECOXIB 200 MG PO CAPS: 200.0000 mg | ORAL_CAPSULE | ORAL | Status: DC

## 2020-07-21 MED ORDER — PROPOFOL 10 MG/ML IV BOLUS
INTRAVENOUS | Status: AC
  Filled 2020-07-21: qty 40

## 2020-07-21 MED ORDER — DEXTROSE 5 % IV SOLN
2.0000 g | Freq: Once | INTRAVENOUS | Status: DC
  Administered 2020-07-21: 2 g via INTRAVENOUS
  Filled 2020-07-21: qty 20

## 2020-07-21 MED ORDER — MIDAZOLAM HCL 2 MG/2ML IJ SOLN
INTRAMUSCULAR | Status: AC
  Filled 2020-07-21: qty 2

## 2020-07-21 MED ORDER — 0.9 % SODIUM CHLORIDE (POUR BTL) OPTIME
TOPICAL | Status: DC | PRN
  Administered 2020-07-21: 1000 mL

## 2020-07-21 MED ORDER — ENSURE PRE-SURGERY PO LIQD: 296.0000 mL | Freq: Once | ORAL | Status: DC

## 2020-07-21 MED ORDER — PROPOFOL 10 MG/ML IV BOLUS
INTRAVENOUS | Status: DC | PRN
  Administered 2020-07-21: 200 mg via INTRAVENOUS

## 2020-07-21 MED ORDER — MIDAZOLAM HCL 5 MG/5ML IJ SOLN
INTRAMUSCULAR | Status: DC | PRN
  Administered 2020-07-21: 2 mg via INTRAVENOUS

## 2020-07-21 MED ORDER — LACTATED RINGERS IV SOLN: INTRAVENOUS | Status: DC

## 2020-07-21 MED ORDER — DEXAMETHASONE SODIUM PHOSPHATE 10 MG/ML IJ SOLN
INTRAMUSCULAR | Status: DC | PRN
  Administered 2020-07-21: 5 mg via INTRAVENOUS

## 2020-07-21 MED ORDER — ORAL CARE MOUTH RINSE: 15.0000 mL | Freq: Once | OROMUCOSAL | Status: AC

## 2020-07-21 MED ORDER — PROMETHAZINE HCL 25 MG/ML IJ SOLN: 6.2500 mg | INTRAMUSCULAR | Status: DC | PRN

## 2020-07-21 MED ORDER — BUPIVACAINE LIPOSOME 1.3 % IJ SUSP
INTRAMUSCULAR | Status: DC | PRN
  Administered 2020-07-21: 20 mL

## 2020-07-21 MED ORDER — GABAPENTIN 300 MG PO CAPS
ORAL_CAPSULE | ORAL | Status: AC
  Administered 2020-07-21: 300 mg via ORAL
  Filled 2020-07-21: qty 1

## 2020-07-21 MED ORDER — FENTANYL CITRATE (PF) 100 MCG/2ML IJ SOLN
INTRAMUSCULAR | Status: AC
  Filled 2020-07-21: qty 2

## 2020-07-21 MED ORDER — PHENYLEPHRINE 40 MCG/ML (10ML) SYRINGE FOR IV PUSH (FOR BLOOD PRESSURE SUPPORT)
PREFILLED_SYRINGE | INTRAVENOUS | Status: DC | PRN
  Administered 2020-07-21: 80 ug via INTRAVENOUS

## 2020-07-21 MED ORDER — ACETAMINOPHEN 500 MG PO TABS
1000.0000 mg | ORAL_TABLET | ORAL | Status: AC
  Administered 2020-07-21: 1000 mg via ORAL
  Filled 2020-07-21: qty 2

## 2020-07-21 MED ORDER — SUGAMMADEX SODIUM 500 MG/5ML IV SOLN
INTRAVENOUS | Status: AC
  Filled 2020-07-21: qty 5

## 2020-07-21 MED ORDER — ROCURONIUM BROMIDE 10 MG/ML (PF) SYRINGE
PREFILLED_SYRINGE | INTRAVENOUS | Status: DC | PRN
  Administered 2020-07-21: 10 mg via INTRAVENOUS
  Administered 2020-07-21: 60 mg via INTRAVENOUS

## 2020-07-21 MED ORDER — SODIUM CHLORIDE 0.9 % IV SOLN
INTRAVENOUS | Status: AC
  Filled 2020-07-21: qty 20

## 2020-07-21 MED ORDER — KETOROLAC TROMETHAMINE 15 MG/ML IJ SOLN
INTRAMUSCULAR | Status: DC | PRN
Start: 2020-07-21 — End: 2020-07-21
  Administered 2020-07-21: 15 mg via INTRAVENOUS

## 2020-07-21 MED ORDER — ONDANSETRON HCL 4 MG/2ML IJ SOLN
INTRAMUSCULAR | Status: DC | PRN
  Administered 2020-07-21: 4 mg via INTRAVENOUS

## 2020-07-21 MED ORDER — SUGAMMADEX SODIUM 500 MG/5ML IV SOLN
INTRAVENOUS | Status: DC | PRN
Start: 2020-07-21 — End: 2020-07-21
  Administered 2020-07-21: 300 mg via INTRAVENOUS

## 2020-07-21 MED ORDER — FENTANYL CITRATE (PF) 100 MCG/2ML IJ SOLN: 25.0000 ug | INTRAMUSCULAR | Status: DC | PRN

## 2020-07-21 MED ORDER — RINGERS IRRIGATION IR SOLN
Status: DC | PRN
  Administered 2020-07-21: 2000 mL

## 2020-07-21 MED ORDER — GABAPENTIN 300 MG PO CAPS: 300.0000 mg | ORAL_CAPSULE | ORAL | Status: AC

## 2020-07-21 MED ORDER — FENTANYL CITRATE (PF) 250 MCG/5ML IJ SOLN
INTRAMUSCULAR | Status: DC | PRN
  Administered 2020-07-21 (×7): 50 ug via INTRAVENOUS

## 2020-07-21 MED ORDER — LIDOCAINE 2% (20 MG/ML) 5 ML SYRINGE
INTRAMUSCULAR | Status: AC
  Filled 2020-07-21: qty 5

## 2020-07-21 SURGICAL SUPPLY — 51 items
APL PRP STRL LF DISP 70% ISPRP (MISCELLANEOUS) ×1
APPLIER CLIP 5 13 M/L LIGAMAX5 (MISCELLANEOUS) ×2
APR CLP MED LRG 5 ANG JAW (MISCELLANEOUS) ×1
BAG SPEC RTRVL 10 TROC 200 (ENDOMECHANICALS) ×1
CABLE HIGH FREQUENCY MONO STRZ (ELECTRODE) ×2 IMPLANT
CHLORAPREP W/TINT 26 (MISCELLANEOUS) ×2 IMPLANT
CLIP APPLIE 5 13 M/L LIGAMAX5 (MISCELLANEOUS) ×1 IMPLANT
COVER MAYO STAND STRL (DRAPES) ×2 IMPLANT
COVER SURGICAL LIGHT HANDLE (MISCELLANEOUS) ×2 IMPLANT
COVER WAND RF STERILE (DRAPES) IMPLANT
DECANTER SPIKE VIAL GLASS SM (MISCELLANEOUS) ×2 IMPLANT
DRAIN CHANNEL 19F RND (DRAIN) IMPLANT
DRAPE C-ARM 42X120 X-RAY (DRAPES) ×2 IMPLANT
DRAPE WARM FLUID 44X44 (DRAPES) ×2 IMPLANT
DRSG TEGADERM 2-3/8X2-3/4 SM (GAUZE/BANDAGES/DRESSINGS) ×2 IMPLANT
DRSG TEGADERM 4X4.75 (GAUZE/BANDAGES/DRESSINGS) ×2 IMPLANT
DRSG TELFA 3X8 NADH (GAUZE/BANDAGES/DRESSINGS) ×2 IMPLANT
ELECT REM PT RETURN 15FT ADLT (MISCELLANEOUS) ×2 IMPLANT
ENDOLOOP SUT PDS II  0 18 (SUTURE)
ENDOLOOP SUT PDS II 0 18 (SUTURE) IMPLANT
EVACUATOR SILICONE 100CC (DRAIN) IMPLANT
GAUZE SPONGE 2X2 8PLY STRL LF (GAUZE/BANDAGES/DRESSINGS) ×1 IMPLANT
GLOVE BIO SURGEON STRL SZ 6.5 (GLOVE) ×2 IMPLANT
GLOVE BIOGEL PI IND STRL 6.5 (GLOVE) ×1 IMPLANT
GLOVE BIOGEL PI INDICATOR 6.5 (GLOVE) ×1
GLOVE ECLIPSE 8.0 STRL XLNG CF (GLOVE) ×2 IMPLANT
GLOVE INDICATOR 8.0 STRL GRN (GLOVE) ×2 IMPLANT
GOWN STRL REUS W/TWL XL LVL3 (GOWN DISPOSABLE) ×4 IMPLANT
IRRIG SUCT STRYKERFLOW 2 WTIP (MISCELLANEOUS) ×2
IRRIGATION SUCT STRKRFLW 2 WTP (MISCELLANEOUS) ×1 IMPLANT
KIT BASIN OR (CUSTOM PROCEDURE TRAY) ×2 IMPLANT
KIT TURNOVER KIT A (KITS) IMPLANT
NEEDLE BIOPSY 14X6 SOFT TISS (NEEDLE) ×2 IMPLANT
PAD POSITIONING PINK XL (MISCELLANEOUS) ×2 IMPLANT
PENCIL SMOKE EVACUATOR (MISCELLANEOUS) IMPLANT
POUCH RETRIEVAL ECOSAC 10 (ENDOMECHANICALS) ×1 IMPLANT
POUCH RETRIEVAL ECOSAC 10MM (ENDOMECHANICALS) ×1
PROTECTOR NERVE ULNAR (MISCELLANEOUS) ×2 IMPLANT
SCISSORS LAP 5X35 DISP (ENDOMECHANICALS) ×2 IMPLANT
SET CHOLANGIOGRAPH MIX (MISCELLANEOUS) ×2 IMPLANT
SET TUBE SMOKE EVAC HIGH FLOW (TUBING) ×2 IMPLANT
SHEARS HARMONIC ACE PLUS 36CM (ENDOMECHANICALS) ×2 IMPLANT
SPONGE GAUZE 2X2 STER 10/PKG (GAUZE/BANDAGES/DRESSINGS) ×1
SUT MNCRL AB 4-0 PS2 18 (SUTURE) ×2 IMPLANT
SUT PDS AB 1 CT1 27 (SUTURE) ×4 IMPLANT
SYR 20ML LL LF (SYRINGE) ×2 IMPLANT
TOWEL OR 17X26 10 PK STRL BLUE (TOWEL DISPOSABLE) ×2 IMPLANT
TOWEL OR NON WOVEN STRL DISP B (DISPOSABLE) ×2 IMPLANT
TRAY LAPAROSCOPIC (CUSTOM PROCEDURE TRAY) ×2 IMPLANT
TROCAR BLADELESS OPT 5 100 (ENDOMECHANICALS) ×2 IMPLANT
TROCAR BLADELESS OPT 5 150 (ENDOMECHANICALS) ×2 IMPLANT

## 2020-07-21 NOTE — Op Note (Signed)
07/21/2020  PATIENT:  Jordan Harper  39 y.o. male  Patient Care Team: Farris Has, MD as PCP - General (Family Medicine) Karie Soda, MD as Consulting Physician (General Surgery)  PRE-OPERATIVE DIAGNOSIS:    Chronic Calculus cholecystitis  POST-OPERATIVE DIAGNOSIS:   Chronic Calculus cholecystitis  Liver: Fatty steatohepatitis  PROCEDURE:  Core Liver Biopsy & SINGLE SITE Laparoscopic cholecystectomy with intraoperative cholangiogram  SURGEON:  Ardeth Sportsman, MD, FACS.  ASSISTANT: Hedda Slade, PA-C An experienced assistant was required given the standard of surgical care given the complexity of the case.  This assistant was needed for exposure, dissection, suctioning, retraction, instrument exchange, etc.  ANESTHESIA:    General with endotracheal intubation Local anesthetic as a field block  EBL:  (See Anesthesia Intraoperative Record) Total I/O In: 1250 [I.V.:1250] Out: -   Delay start of Pharmacological VTE agent (>24hrs) due to surgical blood loss or risk of bleeding:  no  DRAINS: None   SPECIMEN: Gallbladder & Core liver biopsies    DISPOSITION OF SPECIMEN:  PATHOLOGY  COUNTS:  YES  PLAN OF CARE: Discharge to home after PACU  PATIENT DISPOSITION:  PACU - hemodynamically stable.  INDICATION: Pleasant gentleman struggling with intermittent postprandial pain and nausea suspicious for gallbladder etiology.  The rest of the differential diagnosis seems underwhelming.  I offered cholecystectomy  The anatomy & physiology of hepatobiliary & pancreatic function was discussed.  The pathophysiology of gallbladder dysfunction was discussed.  Natural history risks without surgery was discussed.   I feel the risks of no intervention will lead to serious problems that outweigh the operative risks; therefore, I recommended cholecystectomy to remove the pathology.  I explained laparoscopic techniques with possible need for an open approach.  Probable cholangiogram to evaluate  the bilary tract was explained as well.    Risks such as bleeding, infection, abscess, leak, injury to other organs, need for further treatment, heart attack, death, and other risks were discussed.  I noted a good likelihood this will help address the problem.  Possibility that this will not correct all abdominal symptoms was explained.  Goals of post-operative recovery were discussed as well.  We will work to minimize complications.  An educational handout further explaining the pathology and treatment options was given as well.  Questions were answered.  The patient expresses understanding & wishes to proceed with surgery.  OR FINDINGS: Some gallbladder wall thickening and adhesions suspicious for chronic calculus cholecystitis.  Cholangiogram showing normal biliary system without any obstruction or choledocholithiasis.  Some thickening and fatty change in the liver.  Needle core biopsies done   DESCRIPTION:   The patient was identified & brought in the operating room. The patient was positioned supine with arms tucked. SCDs were active during the entire case. The patient underwent general anesthesia without any difficulty.  The abdomen was prepped and draped in a sterile fashion. A Surgical Timeout confirmed our plan.  I made a transverse curvilinear incision through the superior umbilical fold.  I placed a 3mm long port through the supraumbilical fascia using a modified Hassan cutdown technique with umbilical stalk fascial countertraction. I began carbon dioxide insufflation.  No change in end tidal CO2 measurement.   Camera inspection revealed no injury. There were no adhesions to the anterior abdominal wall supraumbilically.  I proceeded to continue with single site technique. I placed a #5 port in left upper aspect of the wound. I placed a 5 mm atraumatic grasper in the right inferior aspect of the wound.  I turned attention  to the right upper quadrant.  The gallbladder fundus was elevated  cephalad. I freed adhesions to the ventral surface of the gallbladder off carefully.  I freed the peritoneal coverings between the gallbladder and the liver on the posteriolateral and anteriomedial walls. I alternated between Harmonic & blunt Maryland dissection to help get a good critical view of the cystic artery and cystic duct.  did further dissection to free all of the gallbladder off the liver bed to get a good critical view of the infundibulum and cystic duct. I dissected out the cystic artery; and, after getting a good 360 view, ligated the anterior & posterior branches of the cystic artery close on the infundibulum using the Harmonic ultrasonic dissection.  I skeletonized the cystic duct.  I placed a clip on the infundibulum. I did a partial cystic duct-otomy and ensured patency. I placed a 5 [place] cholangiocatheter through a puncture site at the right subcostal ridge of the abdominal wall and directed it into the cystic duct.  We ran a cholangiogram with dilute radio-opaque contrast and continuous fluoroscopy. Contrast flowed from a side branch consistent with cystic duct cannulization. Contrast flowed up the common hepatic duct into the right and left intrahepatic chains out to secondary radicals. Contrast flowed down the common bile duct easily across the normal ampulla into the duodenum.  This was consistent with a normal cholangiogram.  I removed the cholangiocatheter. I placed clips on the cystic duct x4.  I completed cystic duct transection.  Because of some fatty change in the liver I decided to do core biopsies.  Used a Tru-Cut 14-gauge needle and did 5 passes to get 3 good cores.  I ensured hemostasis on the gallbladder fossa of the liver and elsewhere. I inspected the rest of the abdomen & detected no injury nor bleeding elsewhere.  I removed the gallbladder out the supraumbilical fascia. I closed the fascia transversely using #1 PDS interrupted stitches. I closed the skin using 4-0  monocryl stitch.  Sterile dressing was applied. The patient was extubated & arrived in the PACU in stable condition..  I had discussed postoperative care with the patient in the holding area. I discussed operative findings, updated the patient's status, discussed probable steps to recovery, and gave postoperative recommendations to the patient's spouse.  Recommendations were made.  Questions were answered.  She expressed understanding & appreciation.  Ardeth Sportsman, M.D., F.A.C.S. Gastrointestinal and Minimally Invasive Surgery [facility] [address] [place], [place] [address] [phone] Main / Paging  07/21/2020 11:00 AM

## 2020-07-21 NOTE — Transfer of Care (Signed)
Immediate Anesthesia Transfer of Care Note  Patient: Jordan Harper  Procedure(s) Performed: LAPAROSCOPIC CHOLECYSTECTOMY SINGLE SITE WITH INTRAOPERATIVE CHOLANGIOGRAM (N/A ) POSSIBLE LIVER NEEDLE CORE BIOPSY (N/A )  Patient Location: PACU  Anesthesia Type:General  Level of Consciousness: awake, drowsy and patient cooperative  Airway & Oxygen Therapy: Patient Spontanous Breathing and Patient connected to face mask oxygen  Post-op Assessment: Report given to RN and Post -op Vital signs reviewed and stable  Post vital signs: Reviewed and stable  Last Vitals:  Vitals Value Taken Time  BP 161/89 07/21/20 1057  Temp 37 C 07/21/20 1057  Pulse 78 07/21/20 1059  Resp 11 07/21/20 1059  SpO2 100 % 07/21/20 1059  Vitals shown include unvalidated device data.  Last Pain:  Vitals:   07/21/20 1057  TempSrc:   PainSc: (P) 0-No pain         Complications: No complications documented.

## 2020-07-21 NOTE — Anesthesia Postprocedure Evaluation (Signed)
Anesthesia Post Note  Patient: Jordan Harper  Procedure(s) Performed: LAPAROSCOPIC CHOLECYSTECTOMY SINGLE SITE WITH INTRAOPERATIVE CHOLANGIOGRAM (N/A ) POSSIBLE LIVER NEEDLE CORE BIOPSY (N/A )     Patient location during evaluation: PACU Anesthesia Type: General Level of consciousness: awake and alert Pain management: pain level controlled Vital Signs Assessment: post-procedure vital signs reviewed and stable Respiratory status: spontaneous breathing, nonlabored ventilation, respiratory function stable and patient connected to nasal cannula oxygen Cardiovascular status: blood pressure returned to baseline and stable Postop Assessment: no apparent nausea or vomiting Anesthetic complications: no   No complications documented.  Last Vitals:  Vitals:   07/21/20 1141 07/21/20 1215  BP: (!) 148/94 (!) 132/73  Pulse: 78 60  Resp: 16 16  Temp: 36.6 C   SpO2: 100% 97%    Last Pain:  Vitals:   07/21/20 1215  TempSrc:   PainSc: 2                  Berit Raczkowski S

## 2020-07-21 NOTE — Interval H&P Note (Signed)
History and Physical Interval Note:  07/21/2020 8:52 AM  Yates Decamp  has presented today for surgery, with the diagnosis of PROBABLE CHRONIC CHOLECYSTITIS.  The various methods of treatment have been discussed with the patient and family. After consideration of risks, benefits and other options for treatment, the patient has consented to  Procedure(s): LAPAROSCOPIC CHOLECYSTECTOMY SINGLE SITE WITH INTRAOPERATIVE CHOLANGIOGRAM (N/A) POSSIBLE LIVER NEEDLE CORE BIOPSY (N/A) as a surgical intervention.  The patient's history has been reviewed, patient examined, no change in status, stable for surgery.  I have reviewed the patient's chart and labs.  Questions were answered to the patient's satisfaction.    I have re-reviewed the the patient's records, history, medications, and allergies.  I have re-examined the patient.  I again discussed intraoperative plans and goals of post-operative recovery.  The patient agrees to proceed.  Benjimen Kelley  11-04-1981 [id number]  Patient Care Team: Farris Has, MD as PCP - General (Family Medicine)  Patient Active Problem List   Diagnosis Date Noted   Chronic cholecystitis with calculus 07/18/2020   DM (diabetes mellitus) (HCC) 07/18/2020   Obesity (BMI 30-39.9) 07/18/2020    Past Medical History:  Diagnosis Date   Diabetes mellitus without complication (HCC)    type 2    Heart murmur    as a child    Hypertension    Hypothyroidism     Past Surgical History:  Procedure Laterality Date   left arm fracture surgery      has plate and screws     Social History   Socioeconomic History   Marital status: Married    Spouse name: Not on file   Number of children: 2   Years of education: 16   Highest education level: Not on file  Occupational History   Not on file  Tobacco Use   Smoking status: Never Smoker   Smokeless tobacco: Never Used  Vaping Use   Vaping Use: Never used  Substance and Sexual Activity   Alcohol use: Yes    Comment:  occ beer    Drug use: Never   Sexual activity: Not on file  Other Topics Concern   Not on file  Social History Narrative   Not on file   Social Determinants of Health   Financial Resource Strain:    Difficulty of Paying Living Expenses:   Food Insecurity:    Worried About Programme researcher, broadcasting/film/video in the Last Year:    Barista in the Last Year:   Transportation Needs:    Freight forwarder (Medical):    Lack of Transportation (Non-Medical):   Physical Activity:    Days of Exercise per Week:    Minutes of Exercise per Session:   Stress:    Feeling of Stress :   Social Connections:    Frequency of Communication with Friends and Family:    Frequency of Social Gatherings with Friends and Family:    Attends Religious Services:    Active Member of Clubs or Organizations:    Attends Engineer, structural:    Marital Status:   Intimate Partner Violence:    Fear of Current or Ex-Partner:    Emotionally Abused:    Physically Abused:    Sexually Abused:     History reviewed. No pertinent family history.  Medications Prior to Admission  Medication Sig Dispense Refill Last Dose   atorvastatin (LIPITOR) 40 MG tablet Take 40 mg by mouth daily.   07/20/2020 at Unknown time  bisoprolol-hydrochlorothiazide (ZIAC) 5-6.25 MG tablet Take 1 tablet by mouth daily.   07/20/2020 at 0600   cetirizine (ZYRTEC) 10 MG tablet Take 10 mg by mouth daily.   07/21/2020 at 0545   levothyroxine (SYNTHROID) 100 MCG tablet Take 100 mcg by mouth daily before breakfast.   07/21/2020 at 0545   losartan-hydrochlorothiazide (HYZAAR) 100-12.5 MG tablet Take 1 tablet by mouth daily.   07/20/2020 at Unknown time   metFORMIN (GLUCOPHAGE-XR) 500 MG 24 hr tablet Take 1,000 mg by mouth 2 (two) times daily.   07/20/2020 at 1700    Current Facility-Administered Medications  Medication Dose Route Frequency Provider Last Rate Last Admin   bupivacaine liposome (EXPAREL) 1.3 % injection 266 mg  20 mL Infiltration  Once Karie Soda, MD       Chlorhexidine Gluconate Cloth 2 % PADS 6 each  6 each Topical Once Karie Soda, MD       And   Chlorhexidine Gluconate Cloth 2 % PADS 6 each  6 each Topical Once Karie Soda, MD       lactated ringers infusion   Intravenous Continuous Arta Bruce, MD 10 mL/hr at 07/21/20 0803 New Bag at 07/21/20 0803     Allergies  Allergen Reactions   Bactrim [Sulfamethoxazole-Trimethoprim] Hives and Rash    BP (!) 153/89   Pulse 71   Temp 98.5 F (36.9 C) (Oral)   Resp 18   Ht 6' (1.829 m)   Wt 103 kg   SpO2 100%   BMI 30.79 kg/m   Labs: Results for orders placed or performed during the hospital encounter of 07/21/20 (from the past 48 hour(s))  Glucose, capillary     Status: Abnormal   Collection Time: 07/21/20  7:45 AM  Result Value Ref Range   Glucose-Capillary 137 (H) 70 - 99 mg/dL    Comment: Glucose reference range applies only to samples taken after fasting for at least 8 hours.    Imaging / Studies: No results found.   Ardeth Sportsman, M.D., F.A.C.S. Gastrointestinal and Minimally Invasive Surgery [facility] [address] [place], [place] [address] [phone] Main / Paging  07/21/2020 8:52 AM    Ardeth Sportsman

## 2020-07-21 NOTE — Anesthesia Procedure Notes (Addendum)
Procedure Name: Intubation Date/Time: 07/21/2020 9:27 AM Performed by: West Pugh, CRNA Pre-anesthesia Checklist: Patient identified, Emergency Drugs available, Suction available, Patient being monitored and Timeout performed Patient Re-evaluated:Patient Re-evaluated prior to induction Oxygen Delivery Method: Circle system utilized Preoxygenation: Pre-oxygenation with 100% oxygen Induction Type: IV induction Ventilation: Mask ventilation without difficulty Laryngoscope Size: 3 and Glidescope Grade View: Grade I Tube type: Oral Tube size: 7.5 mm Number of attempts: 2 Airway Equipment and Method: Video-laryngoscopy and Rigid stylet Placement Confirmation: ETT inserted through vocal cords under direct vision,  positive ETCO2,  CO2 detector and breath sounds checked- equal and bilateral Secured at: 22 cm Tube secured with: Tape Dental Injury: Teeth and Oropharynx as per pre-operative assessment  Difficulty Due To: Difficulty was anticipated, Difficult Airway- due to immobile epiglottis and Difficult Airway- due to limited oral opening Comments: 1st attempt with MAC 4. Grade 3 view unsuccessful. 2nd attempt with glidescope #3. Grade 1 view and successful placement of ETT. Bag mask throughout procedure and between attempts.

## 2020-07-21 NOTE — Anesthesia Preprocedure Evaluation (Addendum)
Anesthesia Evaluation  Patient identified by MRN, date of birth, ID band Patient awake    Reviewed: Allergy & Precautions, NPO status , Patient's Chart, lab work & pertinent test results, reviewed documented beta blocker date and time   Airway Mallampati: IV  TM Distance: >3 FB Neck ROM: Full    Dental  (+) Dental Advisory Given   Pulmonary neg pulmonary ROS,    breath sounds clear to auscultation       Cardiovascular hypertension, Pt. on medications and Pt. on home beta blockers  Rhythm:Regular Rate:Normal     Neuro/Psych negative neurological ROS     GI/Hepatic negative GI ROS, Neg liver ROS,   Endo/Other  diabetesHypothyroidism   Renal/GU negative Renal ROS     Musculoskeletal   Abdominal   Peds  Hematology negative hematology ROS (+)   Anesthesia Other Findings   Reproductive/Obstetrics                            Lab Results  Component Value Date   WBC 7.7 07/19/2020   HGB 15.3 07/19/2020   HCT 46.3 07/19/2020   MCV 88.7 07/19/2020   PLT 223 07/19/2020   Lab Results  Component Value Date   CREATININE 1.09 07/19/2020   BUN 16 07/19/2020   NA 140 07/19/2020   K 3.8 07/19/2020   CL 99 07/19/2020   CO2 30 07/19/2020    Anesthesia Physical Anesthesia Plan  ASA: II  Anesthesia Plan: General   Post-op Pain Management:    Induction: Intravenous  PONV Risk Score and Plan: 2 and Dexamethasone, Ondansetron and Treatment may vary due to age or medical condition  Airway Management Planned: Oral ETT  Additional Equipment: None  Intra-op Plan:   Post-operative Plan: Extubation in OR  Informed Consent: I have reviewed the patients History and Physical, chart, labs and discussed the procedure including the risks, benefits and alternatives for the proposed anesthesia with the patient or authorized representative who has indicated his/her understanding and acceptance.      Dental advisory given  Plan Discussed with: CRNA  Anesthesia Plan Comments:         Anesthesia Quick Evaluation

## 2020-07-21 NOTE — Discharge Instructions (Signed)
LAPAROSCOPIC SURGERY: POST OP INSTRUCTIONS  ######################################################################  EAT Gradually transition to a high fiber diet with a fiber supplement over the next few weeks after discharge.  Start with a pureed / full liquid diet (see below)  WALK Walk an hour a day.  Control your pain to do that.    CONTROL PAIN Control pain so that you can walk, sleep, tolerate sneezing/coughing, go up/down stairs.  HAVE A BOWEL MOVEMENT DAILY Keep your bowels regular to avoid problems.  OK to try a laxative to override constipation.  OK to use an antidairrheal to slow down diarrhea.  Call if not better after 2 tries  CALL IF YOU HAVE PROBLEMS/CONCERNS Call if you are still struggling despite following these instructions. Call if you have concerns not answered by these instructions  ######################################################################    1. DIET: Follow a light bland diet & liquids the first 24 hours after arrival home, such as soup, liquids, starches, etc.  Be sure to drink plenty of fluids.  Quickly advance to a usual solid diet within a few days.  Avoid fast food or heavy meals as your are more likely to get nauseated or have irregular bowels.  A low-fat, high-fiber diet for the rest of your life is ideal.  2. Take your usually prescribed home medications unless otherwise directed.  3. PAIN CONTROL: a. Pain is best controlled by a usual combination of three different methods TOGETHER: i. Ice/Heat ii. Over the counter pain medication iii. Prescription pain medication b. Most patients will experience some swelling and bruising around the incisions.  Ice packs or heating pads (30-60 minutes up to 6 times a day) will help. Use ice for the first few days to help decrease swelling and bruising, then switch to heat to help relax tight/sore spots and speed recovery.  Some people prefer to use ice alone, heat alone, alternating between ice & heat.   Experiment to what works for you.  Swelling and bruising can take several weeks to resolve.   c. It is helpful to take an over-the-counter pain medication regularly for the first few weeks.  Choose one of the following that works best for you: i. Naproxen (Aleve, etc)  Two 220mg tabs twice a day ii. Ibuprofen (Advil, etc) Three 200mg tabs four times a day (every meal & bedtime) iii. Acetaminophen (Tylenol, etc) 500-650mg four times a day (every meal & bedtime) d. A  prescription for pain medication (such as oxycodone, hydrocodone, tramadol, gabapentin, methocarbamol, etc) should be given to you upon discharge.  Take your pain medication as prescribed.  i. If you are having problems/concerns with the prescription medicine (does not control pain, nausea, vomiting, rash, itching, etc), please call us [phone] to see if we need to switch you to a different pain medicine that will work better for you and/or control your side effect better. ii. If you need a refill on your pain medication, please give us 48 hour notice.  contact your pharmacy.  They will contact our office to request authorization. Prescriptions will not be filled after 5 pm or on week-ends  4. Avoid getting constipated.   a. Between the surgery and the pain medications, it is common to experience some constipation.   b. Increasing fluid intake and taking a fiber supplement (such as Metamucil, Citrucel, FiberCon, MiraLax, etc) 1-2 times a day regularly will usually help prevent this problem from occurring.   c. A mild laxative (prune juice, Milk of Magnesia, MiraLax, etc) should be taken according to   package directions if there are no bowel movements after 48 hours.   5. Watch out for diarrhea.   a. If you have many loose bowel movements, simplify your diet to bland foods & liquids for a few days.   b. Stop any stool softeners and decrease your fiber supplement.   c. Switching to mild anti-diarrheal medications (Kayopectate, Pepto  Bismol) can help.   d. If this worsens or does not improve, please call us.  6. Wash / shower every day.  You may shower over the dressings as they are waterproof.  Continue to shower over incision(s) after the dressing is off.  7. Remove your waterproof bandages 5 days after surgery.  You may leave the incision open to air.  You may replace a dressing/Band-Aid to cover the incision for comfort if you wish.   8. ACTIVITIES as tolerated:   a. You may resume regular (light) daily activities beginning the next day--such as daily self-care, walking, climbing stairs--gradually increasing activities as tolerated.  If you can walk 30 minutes without difficulty, it is safe to try more intense activity such as jogging, treadmill, bicycling, low-impact aerobics, swimming, etc. b. Save the most intensive and strenuous activity for last such as sit-ups, heavy lifting, contact sports, etc  Refrain from any heavy lifting or straining until you are off narcotics for pain control.   c. DO NOT PUSH THROUGH PAIN.  Let pain be your guide: If it hurts to do something, don't do it.  Pain is your body warning you to avoid that activity for another week until the pain goes down. d. You may drive when you are no longer taking prescription pain medication, you can comfortably wear a seatbelt, and you can safely maneuver your car and apply brakes. e. You may have sexual intercourse when it is comfortable.  9. FOLLOW UP in our office a. Please call [facility] at [phone] to set up an appointment to see your surgeon in the office for a follow-up appointment approximately 2-3 weeks after your surgery. b. Make sure that you call for this appointment the day you arrive home to insure a convenient appointment time.  10. IF YOU HAVE DISABILITY OR FAMILY LEAVE FORMS, BRING THEM TO THE OFFICE FOR PROCESSING.  DO NOT GIVE THEM TO YOUR DOCTOR.   WHEN TO CALL US [phone]: 1. Poor pain control 2. Reactions / problems with new  medications (rash/itching, nausea, etc)  3. Fever over 101.5 F (38.5 C) 4. Inability to urinate 5. Nausea and/or vomiting 6. Worsening swelling or bruising 7. Continued bleeding from incision. 8. Increased pain, redness, or drainage from the incision   The clinic staff is available to answer your questions during regular business hours (8:30am-5pm).  Please don't hesitate to call and ask to speak to one of our nurses for clinical concerns.   If you have a medical emergency, go to the nearest emergency room or call 911.  A surgeon from [facility] is always on call at the hospitals   [facility] [address], [address], [place], [place]  [address] ? MAIN: [phone] ? TOLL FREE: [phone] ?  FAX [phone] [email or web address]   Cholelithiasis  Cholelithiasis is a form of gallbladder disease in which gallstones form in the gallbladder. The gallbladder is an organ that stores bile. Bile is made in the liver, and it helps to digest fats. Gallstones begin as small crystals and slowly grow into stones. They may cause no symptoms   until the gallbladder tightens (contracts) and a gallstone is blocking the duct (gallbladder attack), which can cause pain. Cholelithiasis is also referred to as gallstones. There are two main types of gallstones:  Cholesterol stones. These are made of hardened cholesterol and are usually yellow-green in color. They are the most common type of gallstone. Cholesterol is a white, waxy, fat-like substance that is made in the liver.  Pigment stones. These are dark in color and are made of a red-yellow substance that forms when hemoglobin from red blood cells breaks down (bilirubin). What are the causes? This condition may be caused by an imbalance in the substances that bile is made of. This can happen if the bile:  Has too much bilirubin.  Has too much cholesterol.  Does not have enough bile salts. These  salts help the body absorb and digest fats. In some cases, this condition can also be caused by the gallbladder not emptying completely or often enough. What increases the risk? The following factors may make you more likely to develop this condition:  Being male.  Having multiple pregnancies. Health care providers sometimes advise removing diseased gallbladders before future pregnancies.  Eating a diet that is heavy in fried foods, fat, and refined carbohydrates, like white bread and white rice.  Being obese.  Being older than age 40.  Prolonged use of medicines that contain male hormones (estrogen).  Having diabetes mellitus.  Rapidly losing weight.  Having a family history of gallstones.  Being of American Indian or [place] descent.  Having an intestinal disease such as Crohn disease.  Having metabolic syndrome.  Having cirrhosis.  Having severe types of anemia such as sickle cell anemia. What are the signs or symptoms? In most cases, there are no symptoms. These are known as silent gallstones. If a gallstone blocks the bile ducts, it can cause a gallbladder attack. The main symptom of a gallbladder attack is sudden pain in the upper right abdomen. The pain usually comes at night or after eating a large meal. The pain can last for one or several hours and can spread to the right shoulder or chest. If the bile duct is blocked for more than a few hours, it can cause infection or inflammation of the gallbladder, liver, or pancreas, which may cause:  Nausea.  Vomiting.  Abdominal pain that lasts for 5 hours or more.  Fever or chills.  Yellowing of the skin or the whites of the eyes (jaundice).  Dark urine.  Light-colored stools. How is this diagnosed? This condition may be diagnosed based on:  A physical exam.  Your medical history.  An ultrasound of your gallbladder.  CT scan.  MRI.  Blood tests to check for signs of infection or inflammation.  A  scan of your gallbladder and bile ducts (biliary system) using nonharmful radioactive material and special cameras that can see the radioactive material (cholescintigram). This test checks to see how your gallbladder contracts and whether bile ducts are blocked.  Inserting a small tube with a camera on the end (endoscope) through your mouth to inspect bile ducts and check for blockages (endoscopic retrograde cholangiopancreatogram). How is this treated? Treatment for gallstones depends on the severity of the condition. Silent gallstones do not need treatment. If the gallstones cause a gallbladder attack or other symptoms, treatment may be required. Options for treatment include:  Surgery to remove the gallbladder (cholecystectomy). This is the most common treatment.  Medicines to dissolve gallstones. These are most effective at treating small gallstones.   You may need to take medicines for up to 6-12 months.  Shock wave treatment (extracorporeal biliary lithotripsy). In this treatment, an ultrasound machine sends shock waves to the gallbladder to break gallstones into smaller pieces. These pieces can then be passed into the intestines or be dissolved by medicine. This is rarely used.  Removing gallstones through endoscopic retrograde cholangiopancreatogram. A small basket can be attached to the endoscope and used to capture and remove gallstones. Follow these instructions at home:  Take over-the-counter and prescription medicines only as told by your health care provider.  Maintain a healthy weight and follow a healthy diet. This includes: ? Reducing fatty foods, such as fried food. ? Reducing refined carbohydrates, like white bread and white rice. ? Increasing fiber. Aim for foods like almonds, fruit, and beans.  Keep all follow-up visits as told by your health care provider. This is important. Contact a health care provider if:  You think you have had a gallbladder attack.  You have been  diagnosed with silent gallstones and you develop abdominal pain or indigestion. Get help right away if:  You have pain from a gallbladder attack that lasts for more than 2 hours.  You have abdominal pain that lasts for more than 5 hours.  You have a fever or chills.  You have persistent nausea and vomiting.  You develop jaundice.  You have dark urine or light-colored stools. Summary  Cholelithiasis (also called gallstones) is a form of gallbladder disease in which gallstones form in the gallbladder.  This condition is caused by an imbalance in the substances that make up bile. This can happen if the bile has too much cholesterol, too much bilirubin, or not enough bile salts.  You are more likely to develop this condition if you are male, pregnant, using medicines with estrogen, obese, older than age 40, or have a family history of gallstones. You may also develop gallstones if you have diabetes, an intestinal disease, cirrhosis, or metabolic syndrome.  Treatment for gallstones depends on the severity of the condition. Silent gallstones do not need treatment.  If gallstones cause a gallbladder attack or other symptoms, treatment may be needed. The most common treatment is surgery to remove the gallbladder. This information is not intended to replace advice given to you by your health care provider. Make sure you discuss any questions you have with your health care provider. Document Revised: 11/29/2017 Document Reviewed: 09/02/2016 Elsevier Patient Education  2020 [employer]  

## 2020-07-22 ENCOUNTER — Other Ambulatory Visit: Payer: Self-pay

## 2020-07-22 ENCOUNTER — Encounter (HOSPITAL_COMMUNITY): Payer: Self-pay | Admitting: Surgery

## 2020-07-22 LAB — SURGICAL PATHOLOGY

## 2020-07-22 NOTE — Addendum Note (Signed)
Addendum  created 07/22/20 0608 by Wynonia Sours, CRNA   Clinical Note Signed, Intraprocedure Blocks edited

## 2021-07-06 IMAGING — US US ABDOMEN LIMITED
1 series · 14 of 25 positions shown · non-contrast
Comparison: None.

CLINICAL DATA: Elevated LFTs.

EXAM:
ULTRASOUND ABDOMEN LIMITED RIGHT UPPER QUADRANT

[Series 1: us abdomen limited · 0.23mm/px · 14 of 50 slices shown]
[im 1/50]
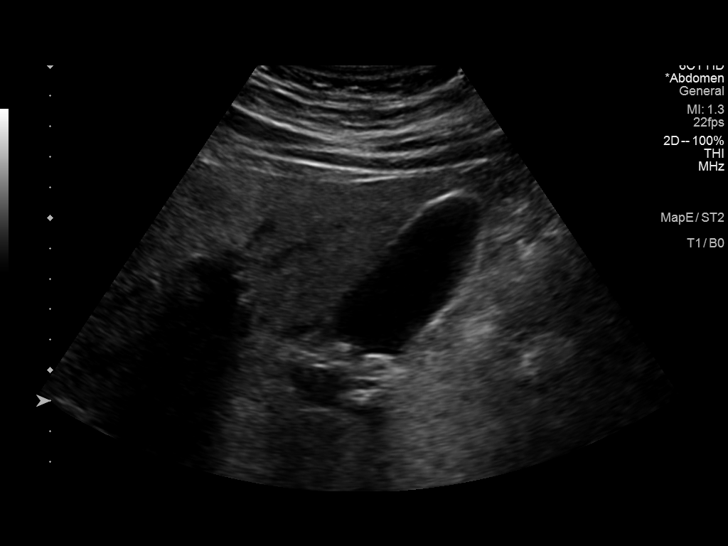
[im 5/50]
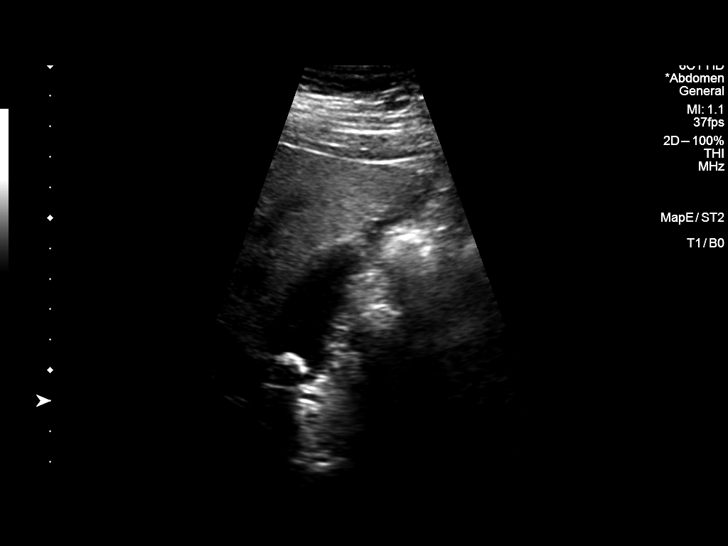
[im 9/50]
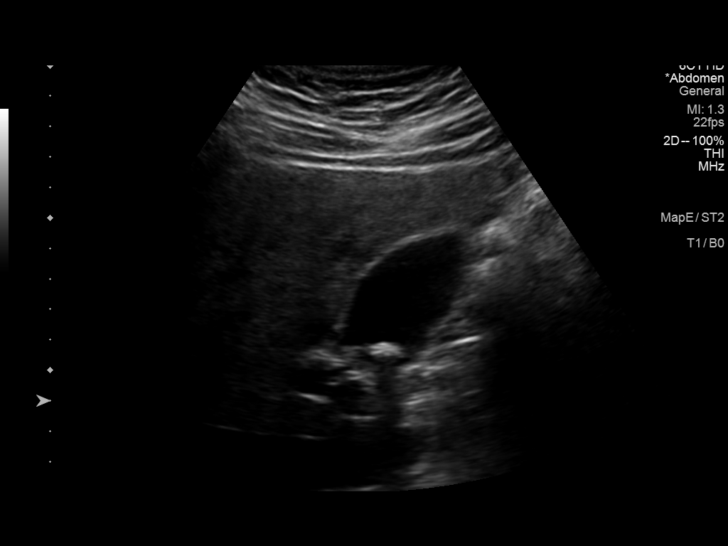
[im 13/50]
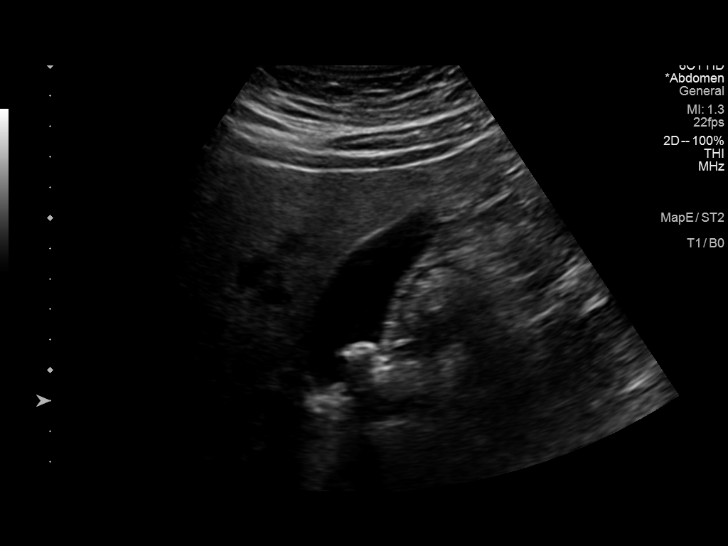
[im 17/50]
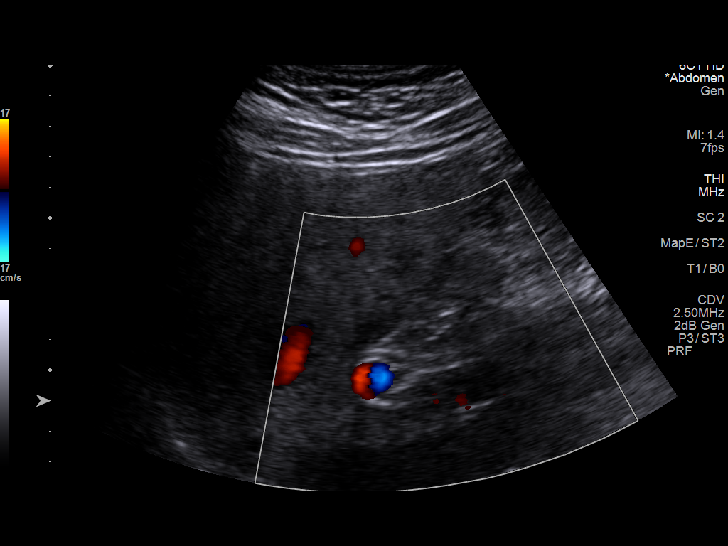
[im 19/50]
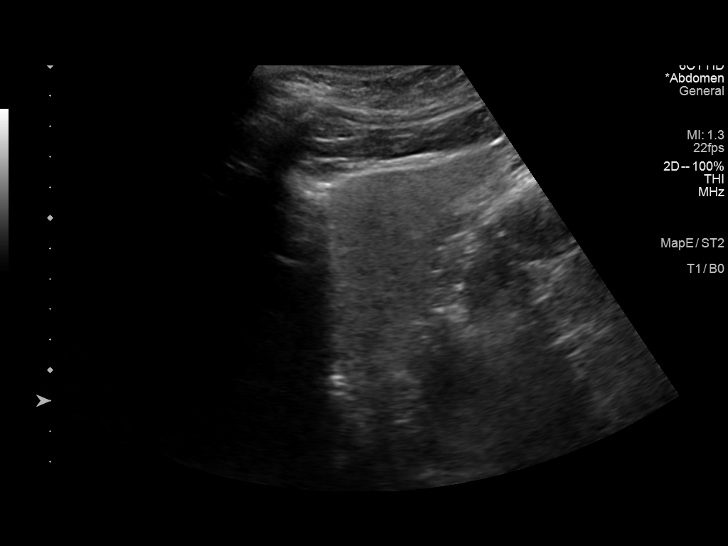
[im 23/50]
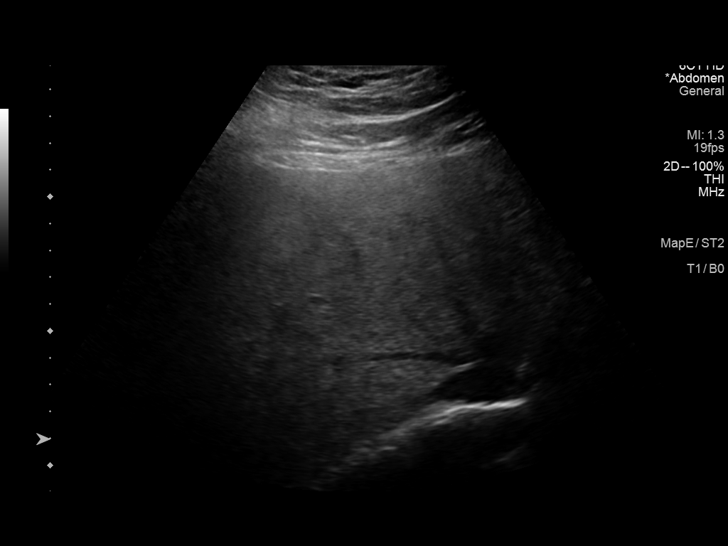
[im 27/50]
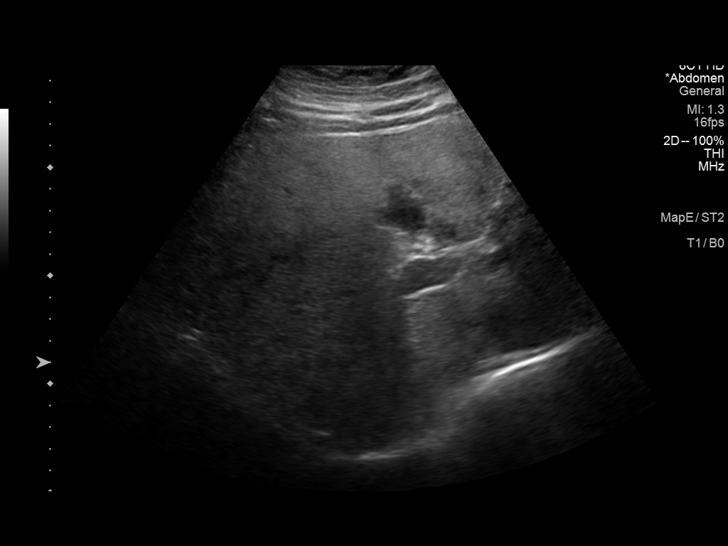
[im 31/50]
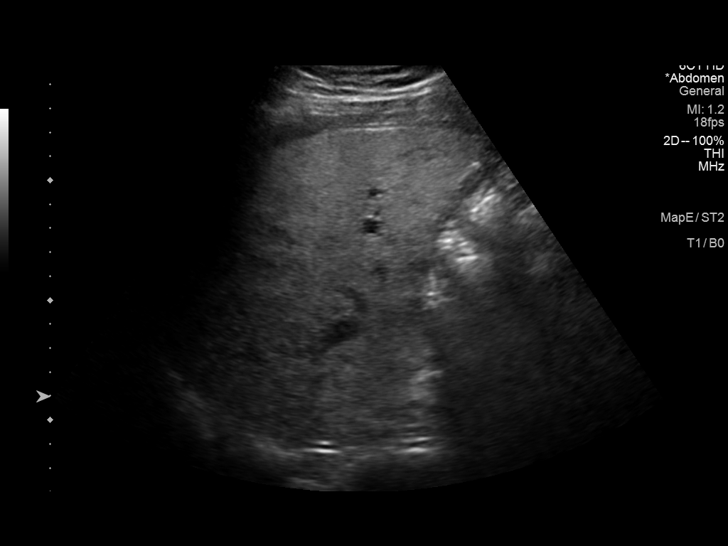
[im 33/50]
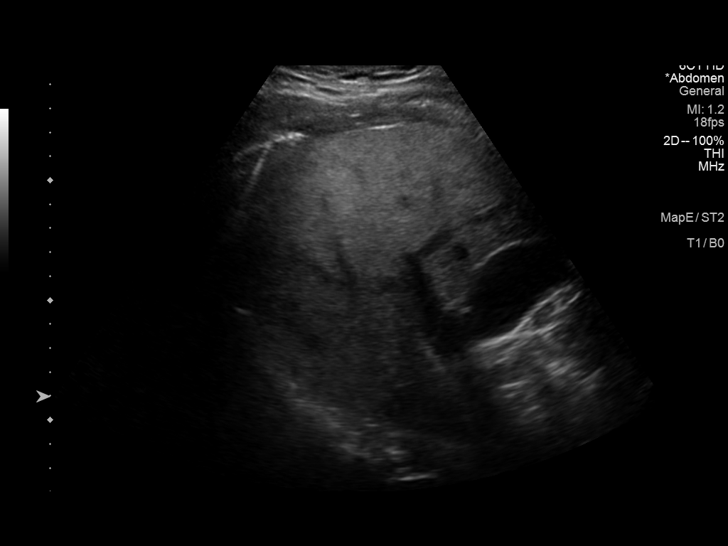
[im 37/50]
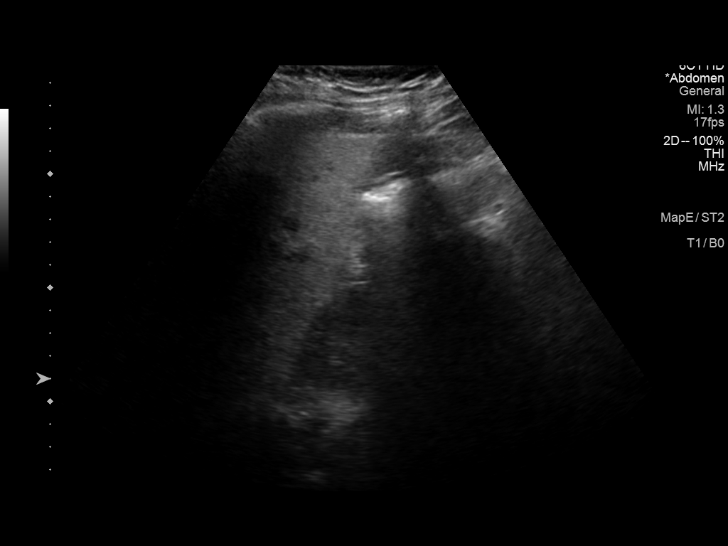
[im 41/50]
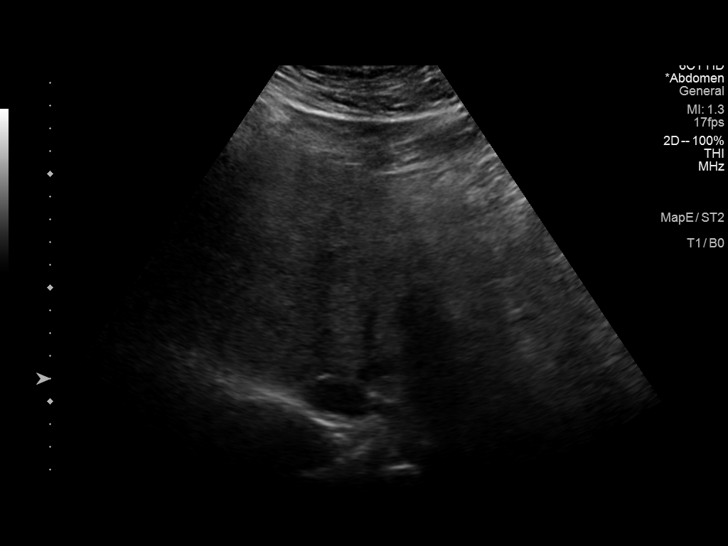
[im 45/50]
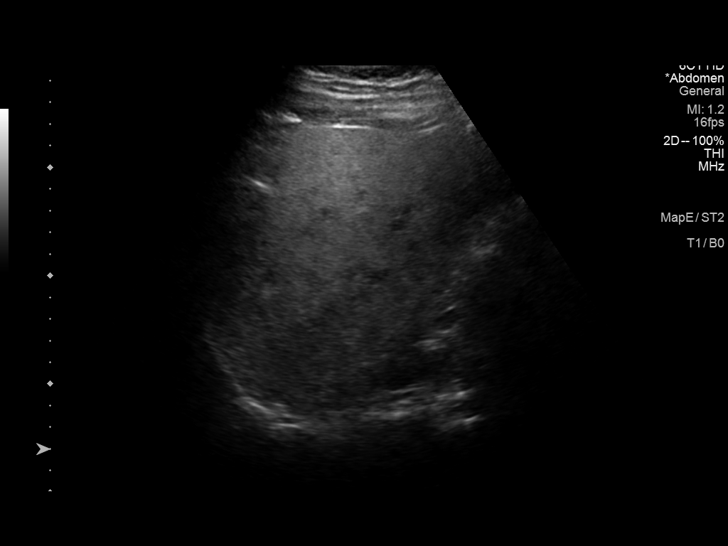
[im 50/50]
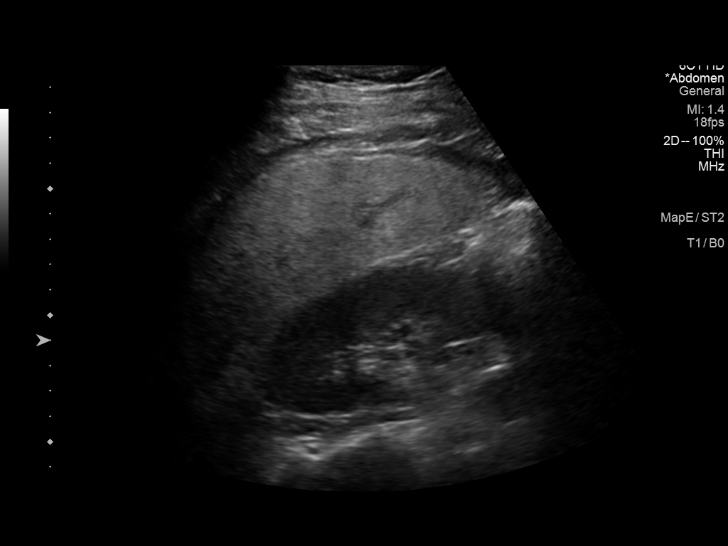

[14 of 25 positions shown; findings below may reference images not displayed]

FINDINGS: Gallbladder:

No wall thickening visualized. Mobile shadowing intraluminal stones
measure up to 1.3 cm. No sonographic Murphy sign noted by
sonographer.

Common bile duct:

Diameter: 3.3 mm.  Within normal limits.

Liver:

No focal lesion identified. Echogenic, heterogenous hepatic
parenchyma. Portal vein is patent on color Doppler imaging with
normal direction of blood flow towards the liver.

Other: None.
IMPRESSION: Cholelithiasis.  No biliary dilatation.

Hepatic steatosis.  No lesions identified.

Please note ultrasound is limited in evaluation of a heterogenous
liver for focal lesions.

## 2021-08-23 IMAGING — RF DG CHOLANGIOGRAM OPERATIVE
1 series · 11 of 11 positions shown · non-contrast
Comparison: Ultrasound 06/03/2020

CLINICAL DATA: Cholelithiasis

EXAM:
INTRAOPERATIVE CHOLANGIOGRAM
TECHNIQUE: Cholangiographic images from the C-arm fluoroscopic device were
submitted for interpretation post-operatively. Please see the
procedural report for the amount of contrast and the fluoroscopy
time utilized.

[Series 1: run · 3 acquisitions, 11 frames shown]
[im 1/3]
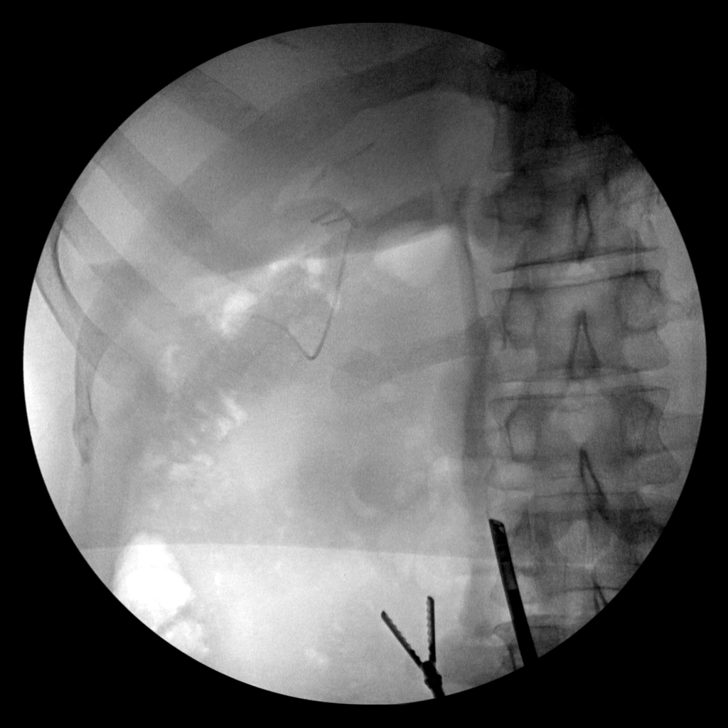
[im 1/3]
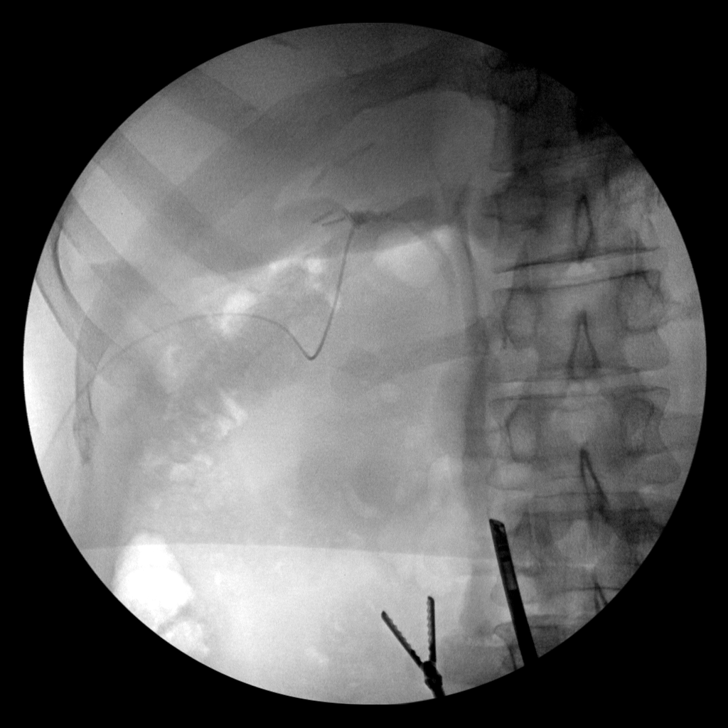
[im 1/3]
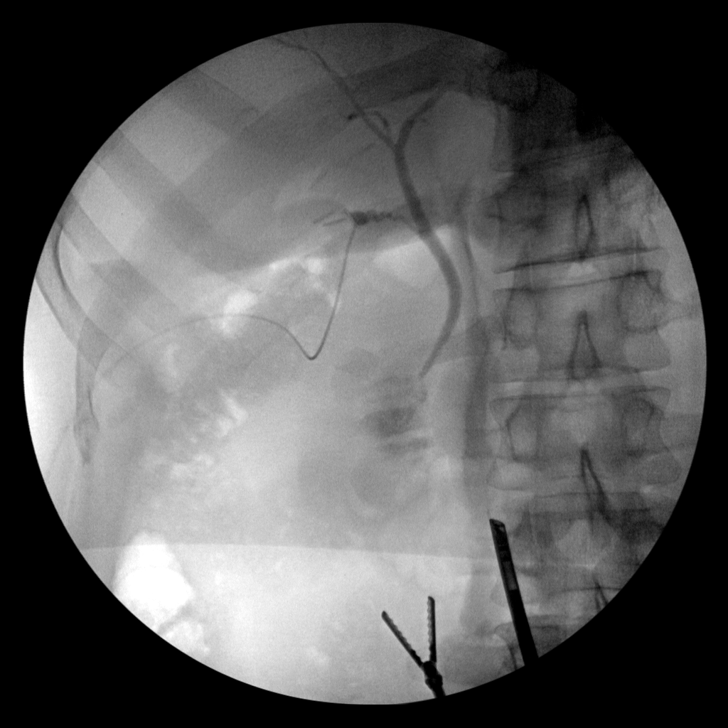
[im 1/3]
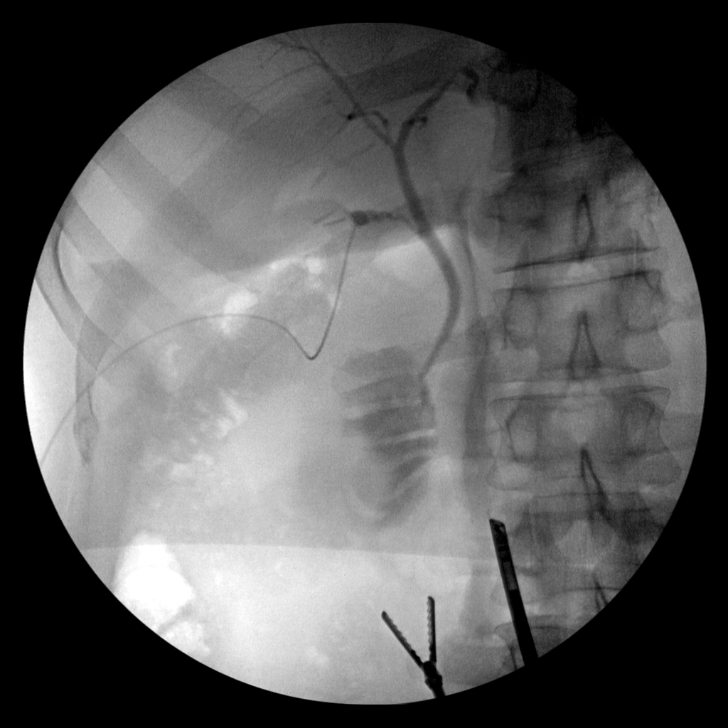
[im 2/3]
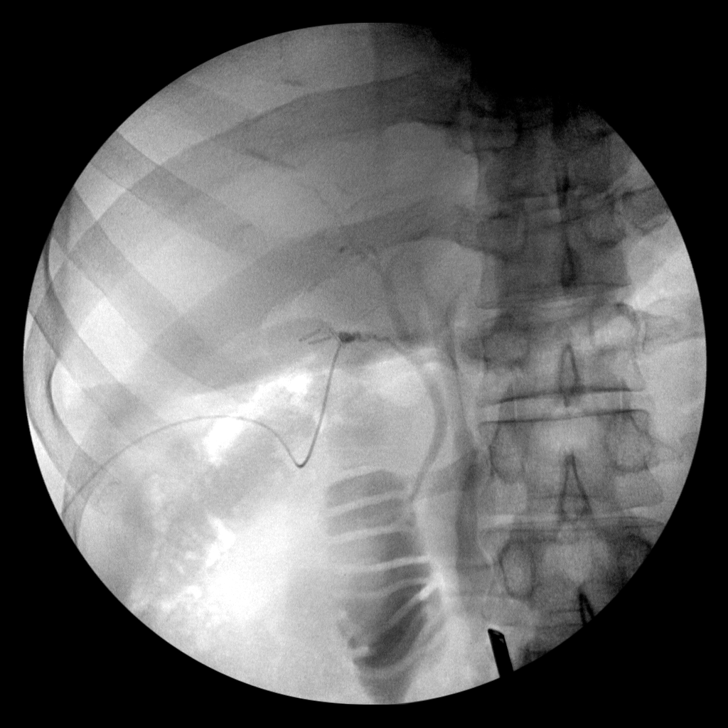
[im 2/3]
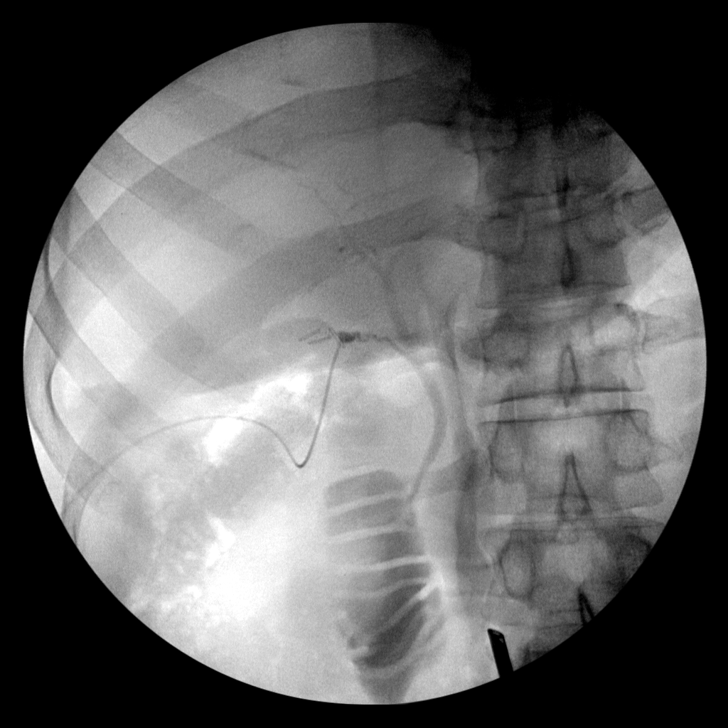
[im 2/3]
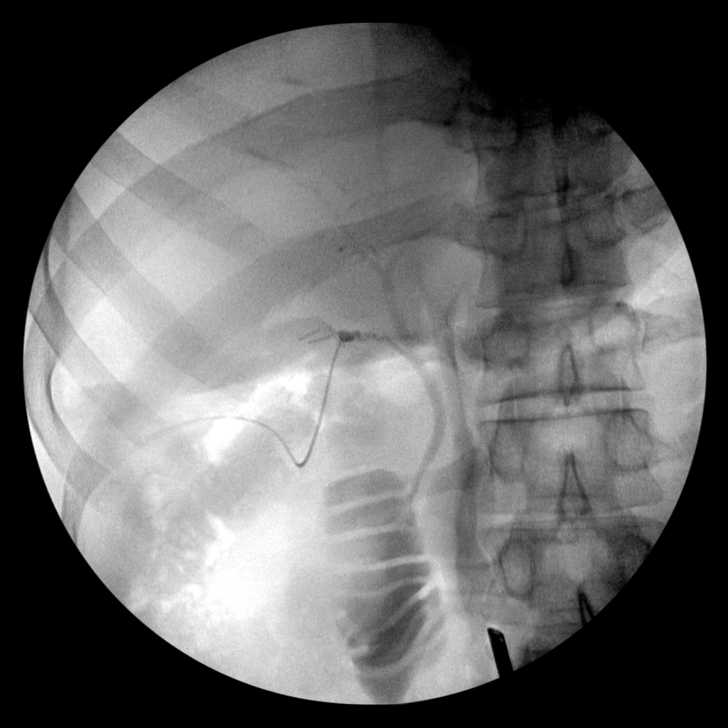
[im 3/3]
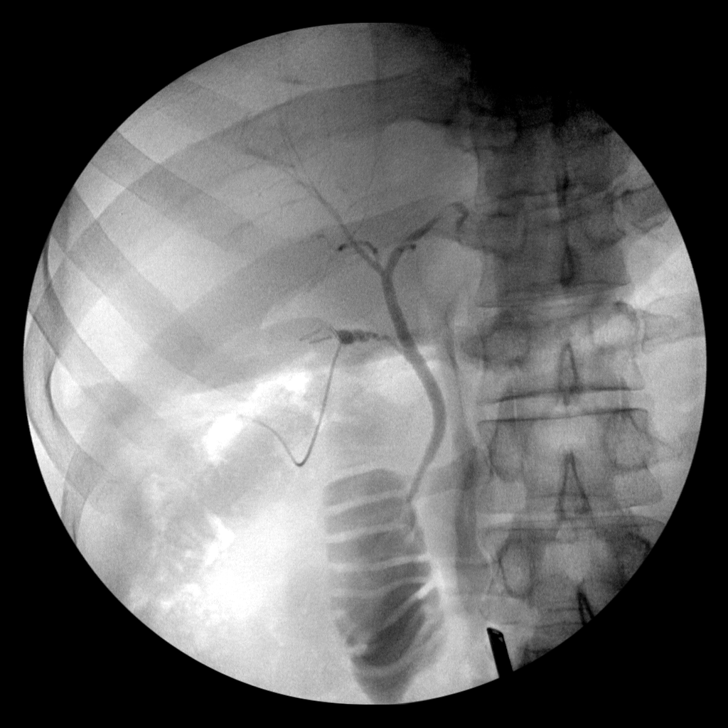
[im 3/3]
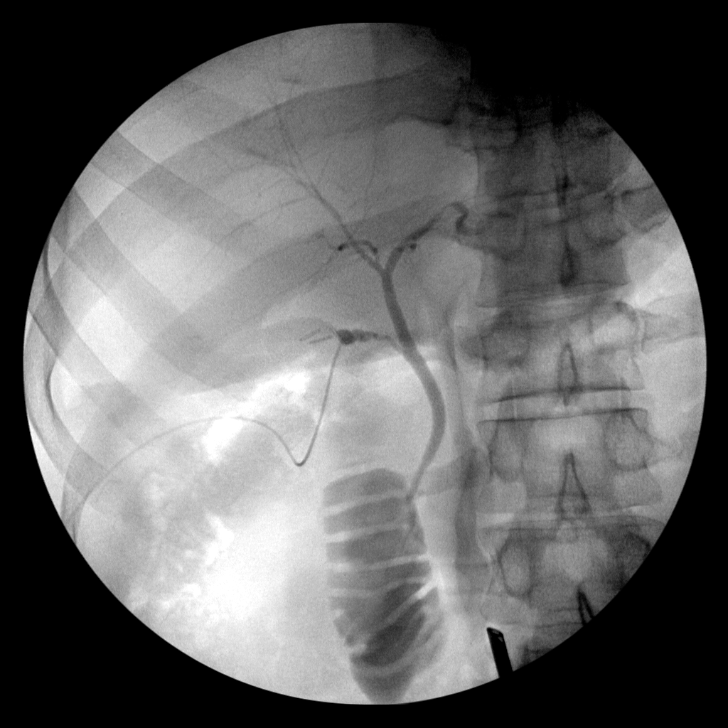
[im 3/3]
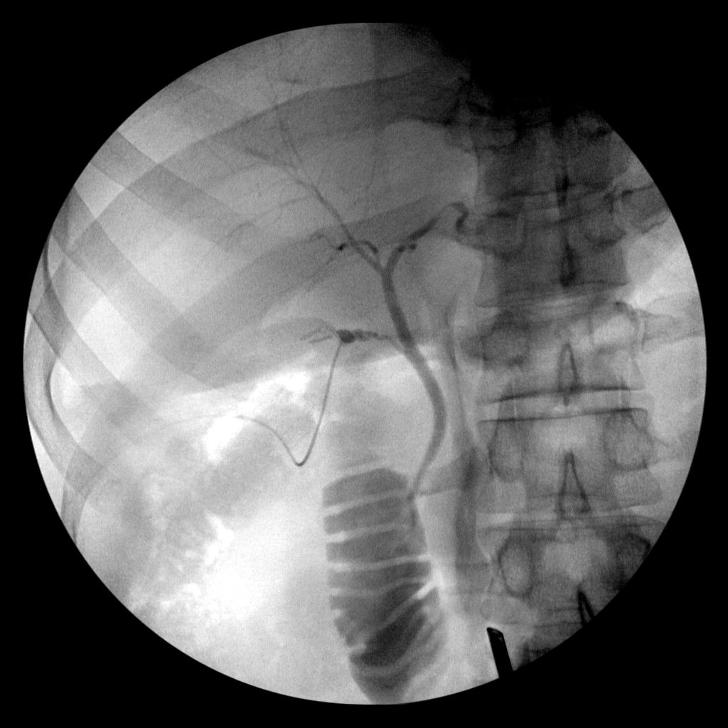
[im 3/3]
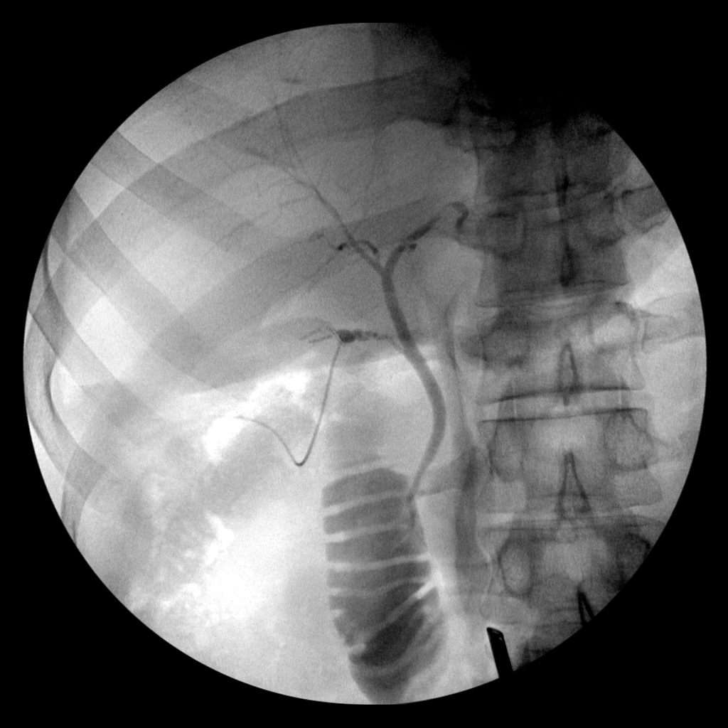

[11 of 11 positions shown; findings below may reference images not displayed]

FINDINGS: No persistent filling defects in the common duct. Intrahepatic ducts
are incompletely visualized, appearing decompressed centrally.
Contrast passes into the duodenum.

:
Negative for retained common duct stone.

## 2022-03-12 ENCOUNTER — Other Ambulatory Visit: Payer: Self-pay

## 2022-03-12 ENCOUNTER — Encounter: Payer: Self-pay | Admitting: Podiatry

## 2022-03-12 ENCOUNTER — Ambulatory Visit: Payer: BC Managed Care – PPO | Admitting: Podiatry

## 2022-03-12 DIAGNOSIS — B351 Tinea unguium: Secondary | ICD-10-CM | POA: Diagnosis not present

## 2022-03-12 MED ORDER — TERBINAFINE HCL 250 MG PO TABS: ORAL_TABLET | ORAL | 0 refills | Status: AC

## 2022-03-12 NOTE — Progress Notes (Signed)
Subjective:  ? ?Patient ID: Jordan Harper, male   DOB: 41 y.o.   MRN: [id number]  ? ?HPI ?Patient presents stating I have got discoloration on my right big toe and its been there for several months.  Does not remember injury.  Patient does not smoke likes to be active ? ? ?Review of Systems  ?All other systems reviewed and are negative. ? ? ?   ?Objective:  ?Physical Exam ?Vitals and nursing note reviewed.  ?Constitutional:   ?   Appearance: He is well-developed.  ?Pulmonary:  ?   Effort: Pulmonary effort is normal.  ?Musculoskeletal:     ?   General: Normal range of motion.  ?Skin: ?   General: Skin is warm.  ?Neurological:  ?   Mental Status: He is alert.  ?  ?Neurovascular status intact muscle strength adequate range of motion within normal limits with patient being diabetic with his A1c at 6.7.  On the medial side of the right hallux along the entire medial border of there is yellow discoloration localized with no central or lateral exposure.  No other nails have involvement ? ?   ?Assessment:  ?Low-grade mycotic nail infection with possibility for trauma or moderate systemic causes ? ?   ?Plan:  ?H&P reviewed all conditions and currently recommend a more medial approach to this with a pulse Lamisil treatment along with laser therapy 2-3 visits and topical formula 7.  I do think this will solve the problem will be seen back if it does not ?   ? ? ?

## 2022-03-19 ENCOUNTER — Other Ambulatory Visit: Payer: Self-pay

## 2022-03-19 ENCOUNTER — Ambulatory Visit (INDEPENDENT_AMBULATORY_CARE_PROVIDER_SITE_OTHER): Payer: BC Managed Care – PPO

## 2022-03-19 DIAGNOSIS — B351 Tinea unguium: Secondary | ICD-10-CM

## 2022-03-19 NOTE — Progress Notes (Signed)
Patient presents today for the 1st laser treatment. Diagnosed with mycotic nail infection by Dr. Charlsie Merles.  ? ?Toenail most affected right hallux. ? ?All other systems are negative. ? ?Nails were filed thin. Laser therapy was administered to right hallux toenails  and patient tolerated the treatment well. All safety precautions were in place.  ? ? ?Follow up in 6 weeks for laser # 2. ? ?Patient reports taking terbinafine and formula 7 as prescribed. Dr. Charlsie Merles recommends 2-3 laser treatments  ?

## 2022-03-19 NOTE — Patient Instructions (Signed)

## 2022-05-04 ENCOUNTER — Ambulatory Visit (INDEPENDENT_AMBULATORY_CARE_PROVIDER_SITE_OTHER): Payer: Self-pay

## 2022-05-04 DIAGNOSIS — B351 Tinea unguium: Secondary | ICD-10-CM

## 2022-05-04 NOTE — Progress Notes (Signed)
Patient presents today for the 2nd laser treatment. Diagnosed with mycotic nail infection by Dr. Charlsie Merles.  ? ?Toenail most affected right hallux. ? ?All other systems are negative. ? ?Nails were filed thin. Laser therapy was administered to right hallux toenails  and patient tolerated the treatment well. All safety precautions were in place.  ? ? ?Follow up in 6 weeks for laser # 3. ? ?Patient reports taking terbinafine and formula 7 as prescribed. Dr. Charlsie Merles recommends 2-3 laser treatments  ?

## 2022-06-15 ENCOUNTER — Ambulatory Visit (INDEPENDENT_AMBULATORY_CARE_PROVIDER_SITE_OTHER): Payer: Self-pay

## 2022-06-15 DIAGNOSIS — B351 Tinea unguium: Secondary | ICD-10-CM

## 2022-06-15 NOTE — Progress Notes (Signed)
Patient presents today for the 3rd laser treatment. Diagnosed with mycotic nail infection by Dr. Charlsie Merles.   Toenail most affected right hallux.  All other systems are negative.  Nails were filed thin. Laser therapy was administered to right hallux toenails  and patient tolerated the treatment well. All safety precautions were in place.    Patient has completed the recommended laser treatments. He will follow up with Dr. Charlsie Merles in 3 months to evaluate progress.    Patient reports taking terbinafine and formula 7 as prescribed. Dr. Charlsie Merles recommends 2-3 laser treatments

## 2022-09-14 ENCOUNTER — Ambulatory Visit: Payer: BC Managed Care – PPO | Admitting: Podiatry

## 2022-09-28 ENCOUNTER — Encounter: Payer: Self-pay | Admitting: Podiatry

## 2022-09-28 ENCOUNTER — Ambulatory Visit: Payer: BC Managed Care – PPO | Admitting: Podiatry

## 2022-09-28 DIAGNOSIS — B351 Tinea unguium: Secondary | ICD-10-CM

## 2022-09-28 NOTE — Progress Notes (Signed)
Subjective:   Patient ID: Jordan Harper, male   DOB: 41 y.o.   MRN: [id number]   HPI Patient states very happy with laser at this time and nail has grown out normally   ROS      Objective:  Physical Exam  Neurovascular status intact nail site healing very well and no current nail disease right big toenail     Assessment:  Doing well post antifungal oral and laser treatment     Plan:  Reviewed condition and we may have to repeat at 1 point future and I educated him on what to look for and at this time patient is discharged
